# Patient Record
Sex: Male | Born: 1980 | Race: White | Hispanic: No | Marital: Single | Smoking: Former smoker
Health system: Southern US, Community
[De-identification: ages and names within clinical notes are randomized; demographics above are authoritative.]

## PROBLEM LIST (undated history)

## (undated) DIAGNOSIS — F419 Anxiety disorder, unspecified: Secondary | ICD-10-CM

## (undated) DIAGNOSIS — M109 Gout, unspecified: Secondary | ICD-10-CM

## (undated) DIAGNOSIS — G4733 Obstructive sleep apnea (adult) (pediatric): Secondary | ICD-10-CM

## (undated) DIAGNOSIS — I10 Essential (primary) hypertension: Secondary | ICD-10-CM

## (undated) DIAGNOSIS — F909 Attention-deficit hyperactivity disorder, unspecified type: Secondary | ICD-10-CM

## (undated) HISTORY — DX: Obstructive sleep apnea (adult) (pediatric): G47.33

## (undated) HISTORY — PX: CYSTECTOMY: SUR359

---

## 1998-07-09 ENCOUNTER — Emergency Department (HOSPITAL_COMMUNITY): Admission: EM | Admit: 1998-07-09 | Discharge: 1998-07-09 | Payer: Self-pay | Admitting: Emergency Medicine

## 1998-07-09 ENCOUNTER — Encounter: Payer: Self-pay | Admitting: Emergency Medicine

## 1998-09-28 ENCOUNTER — Emergency Department (HOSPITAL_COMMUNITY): Admission: EM | Admit: 1998-09-28 | Discharge: 1998-09-28 | Payer: Self-pay | Admitting: Internal Medicine

## 1998-10-01 ENCOUNTER — Emergency Department (HOSPITAL_COMMUNITY): Admission: EM | Admit: 1998-10-01 | Discharge: 1998-10-02 | Payer: Self-pay | Admitting: Emergency Medicine

## 2001-09-30 ENCOUNTER — Emergency Department (HOSPITAL_COMMUNITY): Admission: EM | Admit: 2001-09-30 | Discharge: 2001-09-30 | Payer: Self-pay | Admitting: Emergency Medicine

## 2001-10-04 ENCOUNTER — Emergency Department (HOSPITAL_COMMUNITY): Admission: EM | Admit: 2001-10-04 | Discharge: 2001-10-04 | Payer: Self-pay | Admitting: Emergency Medicine

## 2003-12-28 ENCOUNTER — Emergency Department (HOSPITAL_COMMUNITY): Admission: EM | Admit: 2003-12-28 | Discharge: 2003-12-28 | Payer: Self-pay | Admitting: Emergency Medicine

## 2005-12-10 ENCOUNTER — Emergency Department (HOSPITAL_COMMUNITY): Admission: EM | Admit: 2005-12-10 | Discharge: 2005-12-10 | Payer: Self-pay | Admitting: Emergency Medicine

## 2005-12-17 ENCOUNTER — Emergency Department (HOSPITAL_COMMUNITY): Admission: EM | Admit: 2005-12-17 | Discharge: 2005-12-17 | Payer: Self-pay | Admitting: Emergency Medicine

## 2006-01-13 ENCOUNTER — Ambulatory Visit (HOSPITAL_BASED_OUTPATIENT_CLINIC_OR_DEPARTMENT_OTHER): Admission: RE | Admit: 2006-01-13 | Discharge: 2006-01-13 | Payer: Self-pay | Admitting: Otolaryngology

## 2012-08-14 ENCOUNTER — Encounter (HOSPITAL_COMMUNITY): Payer: Self-pay | Admitting: *Deleted

## 2012-08-14 ENCOUNTER — Emergency Department (HOSPITAL_COMMUNITY)
Admission: EM | Admit: 2012-08-14 | Discharge: 2012-08-15 | Disposition: A | Discharge status: Home / Self Care | Payer: Self-pay | Attending: Emergency Medicine | Admitting: Emergency Medicine

## 2012-08-14 DIAGNOSIS — K089 Disorder of teeth and supporting structures, unspecified: Secondary | ICD-10-CM | POA: Insufficient documentation

## 2012-08-14 DIAGNOSIS — K0889 Other specified disorders of teeth and supporting structures: Secondary | ICD-10-CM

## 2012-08-14 DIAGNOSIS — L03211 Cellulitis of face: Secondary | ICD-10-CM

## 2012-08-14 DIAGNOSIS — H539 Unspecified visual disturbance: Secondary | ICD-10-CM | POA: Insufficient documentation

## 2012-08-14 DIAGNOSIS — H5710 Ocular pain, unspecified eye: Secondary | ICD-10-CM | POA: Insufficient documentation

## 2012-08-14 DIAGNOSIS — L0201 Cutaneous abscess of face: Secondary | ICD-10-CM | POA: Insufficient documentation

## 2012-08-14 DIAGNOSIS — H9209 Otalgia, unspecified ear: Secondary | ICD-10-CM | POA: Insufficient documentation

## 2012-08-14 DIAGNOSIS — K029 Dental caries, unspecified: Secondary | ICD-10-CM

## 2012-08-14 DIAGNOSIS — Z79899 Other long term (current) drug therapy: Secondary | ICD-10-CM | POA: Insufficient documentation

## 2012-08-14 DIAGNOSIS — F909 Attention-deficit hyperactivity disorder, unspecified type: Secondary | ICD-10-CM | POA: Insufficient documentation

## 2012-08-14 DIAGNOSIS — Z87891 Personal history of nicotine dependence: Secondary | ICD-10-CM | POA: Insufficient documentation

## 2012-08-14 HISTORY — DX: Attention-deficit hyperactivity disorder, unspecified type: F90.9

## 2012-08-14 MED ORDER — OXYCODONE-ACETAMINOPHEN 5-325 MG PO TABS
2.0000 | ORAL_TABLET | Freq: Once | ORAL | Status: AC
  Administered 2012-08-14: 2 via ORAL
  Filled 2012-08-14: qty 2

## 2012-08-14 NOTE — ED Notes (Signed)
Pt c/o dental abscess x1 day.  Reports 8/10 pain.  Swelling noted on r jaw. Pt NAD at this time.

## 2012-08-14 NOTE — ED Provider Notes (Signed)
History    This chart was scribed for non-physician practitioner working with Celene Kras, MD by Leone Payor, ED Scribe. This patient was seen in room WA11/WA11 and the patient's care was started at 2205.   CSN: [id number]  Arrival date & time 08/14/12  2205   First MD Initiated Contact with Patient 08/14/12 2307      Chief Complaint  Patient presents with  . Abscess  . Dental Pain  . Facial Swelling     The history is provided by the patient. No language interpreter was used.    Geoffry Bannister is a 32 y.o. male who presents to the Emergency Department complaining of constant, gradually worsening dental pain to the right upper side onset yesterday with swelling of the right jaw that occurred overnight. Pt reports having a h/o dental abscesses. He denies seeing a dentist.  Pt rates the pain as 8/10. He denies using OTC medications for pain or any steroids or immunosuppressant drugs. Denies IV drug use. Has trouble eating 2/2 pain but is okay drinking. He has right sided ear pain, right sided eye pain, blurry vision. He denies fever, chills, nausea, vomiting, trouble swallowing, SOB.   Pt is a former smoker but denies alcohol use.    Past Medical History  Diagnosis Date  . ADHD (attention deficit hyperactivity disorder)     History reviewed. No pertinent past surgical history.  History reviewed. No pertinent family history.  History  Substance Use Topics  . Smoking status: Former Smoker    Quit date: 02/26/2012  . Smokeless tobacco: Not on file  . Alcohol Use: No      Review of Systems  Constitutional: Negative for fever, diaphoresis, appetite change, fatigue and unexpected weight change.  HENT: Positive for ear pain (right sided), facial swelling and dental problem. Negative for mouth sores, trouble swallowing and neck stiffness.   Eyes: Positive for pain (right sided) and visual disturbance.  Respiratory: Negative for cough, chest tightness, shortness of breath  and wheezing.   Cardiovascular: Negative for chest pain.  Gastrointestinal: Negative for nausea, vomiting, abdominal pain, diarrhea and constipation.  Endocrine: Negative for polydipsia, polyphagia and polyuria.  Genitourinary: Negative for dysuria, urgency, frequency and hematuria.  Musculoskeletal: Negative for back pain.  Skin: Negative for rash.  Allergic/Immunologic: Negative for immunocompromised state.  Neurological: Negative for syncope, light-headedness and headaches.  Hematological: Does not bruise/bleed easily.  Psychiatric/Behavioral: Negative for sleep disturbance. The patient is not nervous/anxious.     Allergies  Review of patient's allergies indicates no known allergies.  Home Medications   Current Outpatient Rx  Name  Route  Sig  Dispense  Refill  . amphetamine-dextroamphetamine (ADDERALL) 30 MG tablet   Oral   Take 30 mg by mouth daily.         . clindamycin (CLEOCIN) 150 MG capsule   Oral   Take 3 capsules (450 mg total) by mouth 3 (three) times daily.   90 capsule   0   . HYDROcodone-acetaminophen (NORCO/VICODIN) 5-325 MG per tablet   Oral   Take 2 tablets by mouth every 4 (four) hours as needed for pain.   10 tablet   0     BP 141/81  Pulse 73  Temp(Src) 98.3 F (36.8 C) (Oral)  Resp 17  SpO2 97%  Physical Exam  Nursing note and vitals reviewed. Constitutional: He is oriented to person, place, and time. He appears well-developed and well-nourished.  HENT:  Head: Normocephalic.  Right Ear: Tympanic membrane, external ear  and ear canal normal.  Left Ear: Tympanic membrane, external ear and ear canal normal.  Nose: Nose normal. Right sinus exhibits no maxillary sinus tenderness and no frontal sinus tenderness. Left sinus exhibits no maxillary sinus tenderness and no frontal sinus tenderness.  Mouth/Throat: Uvula is midline, oropharynx is clear and moist and mucous membranes are normal. No oral lesions. Abnormal dentition. Dental caries present.  No edematous or lacerations. No oropharyngeal exudate, posterior oropharyngeal edema, posterior oropharyngeal erythema or tonsillar abscesses.  Tenderness to palpation over right maxillary sinus.  Multiple caries and missing teeth. Mild erythema and swelling of the gumline, no gross abscess  Eyes: Conjunctivae and EOM are normal. Pupils are equal, round, and reactive to light. Right eye exhibits no discharge. Left eye exhibits no discharge. Right conjunctiva is not injected. Right conjunctiva has no hemorrhage. Left conjunctiva is not injected. Left conjunctiva has no hemorrhage. No scleral icterus. Right eye exhibits normal extraocular motion. Left eye exhibits normal extraocular motion.  Neck: Normal range of motion. Neck supple.  Cardiovascular: Normal rate, regular rhythm, normal heart sounds and intact distal pulses.   Pulmonary/Chest: Effort normal and breath sounds normal. No respiratory distress. He has no wheezes.  Abdominal: Soft. Bowel sounds are normal. He exhibits no distension. There is no tenderness.  Musculoskeletal: Normal range of motion.  Lymphadenopathy:       Head (right side): No submental, no submandibular, no tonsillar, no preauricular, no posterior auricular and no occipital adenopathy present.       Head (left side): No submental, no submandibular, no tonsillar, no preauricular, no posterior auricular and no occipital adenopathy present.    He has no cervical adenopathy.  Neurological: He is alert and oriented to person, place, and time. No cranial nerve deficit. Coordination normal.  Skin: Skin is warm and dry.  Psychiatric: He has a normal mood and affect.    ED Course  Procedures (including critical care time)  DIAGNOSTIC STUDIES: Oxygen Saturation is 99% on room air, normal by my interpretation.    COORDINATION OF CARE: 11:52 PM Discussed treatment plan with pt at bedside and pt agreed to plan.    Labs Reviewed - No data to display Ct Maxillofacial  W/cm  08/15/2012  *RADIOLOGY REPORT*  Clinical Data: Facial pain and swelling mainly around the right side of the jaw.  CT MAXILLOFACIAL WITH CONTRAST  Technique:  Multidetector CT imaging of the maxillofacial structures was performed with intravenous contrast. Multiplanar CT image reconstructions were also generated.  Contrast: OMNIPAQUE IOHEXOL 300 MG/ML  SOLN  Comparison: None.  Findings: There is subcutaneous swelling, edema and fluid in the right facial area without discrete abscess.  There is extensive dental disease with numerous dental caries. Apical lucency around several teeth are noted bilaterally. There is apical lucency around the right maxillary canine tooth which could be responsible for the adjacent facial inflammation/cellulitis.  No discrete bone abscess. The tongue base and floor of mouth are unremarkable.  The paranasal sinuses are grossly clear.  There is minimal mucoperiosteal thickening involving the right maxillary sinus.  No air-fluid levels.  The mastoid air cells and middle ear cavities are clear.  The globes are intact.  No orbital cellulitis.  The visualized portion of the brain is normal.  The epiglottis is normal and the aryepiglottic folds are normal.  The piriform sinus and vallecular air spaces are clear.  The paraglottic fat planes are maintained. Mild enlargement of the tonsils but no inflammation or abscess.  There are small scattered bilateral  neck nodes but no adenopathy or mass.  IMPRESSION:  1.  Bilateral maxillary and mandibular dental disease. 2.  Right-sided facial cellulitis but no discrete abscess.   Original Report Authenticated By: Rudie Meyer, M.D.      1. Pain, dental   2. Dental caries   3. Cellulitis, face       MDM  Tamsen Meek presents with dental pain and concern of dental abscess.  Patient with toothache.  No gross abscess.  Pt exam concerning for possible deep seated abscess.  Will obtain CT maxillofacial.  Pt pain controlled in the  ER.    Ct with Bilateral maxillary and mandibular dental disease. Right-sided facial cellulitis but no discrete abscess.  Exam unconcerning for Ludwig's angina or spread of infection.  Will treat with clindamycin and pain medicine.  Urged patient to follow-up with dentist.  Pt is afebrile, NAD, nontoxic, nonseptic appearing.  Pt is without diplopia and there is no evidence for periorbital or preseptal cellulitis.    I personally performed the services described in this documentation, which was scribed in my presence. The recorded information has been reviewed and is accurate.   Dahlia Client Weldon Nouri, PA-C 08/15/12 0111  Teagen Bucio, PA-C 08/15/12 4540

## 2012-08-15 ENCOUNTER — Emergency Department (HOSPITAL_COMMUNITY): Payer: Self-pay

## 2012-08-15 ENCOUNTER — Encounter (HOSPITAL_COMMUNITY): Payer: Self-pay

## 2012-08-15 MED ORDER — PENICILLIN V POTASSIUM 500 MG PO TABS: 500.0000 mg | ORAL_TABLET | Freq: Four times a day (QID) | ORAL | Status: DC

## 2012-08-15 MED ORDER — HYDROCODONE-ACETAMINOPHEN 5-325 MG PO TABS: 2.0000 | ORAL_TABLET | ORAL | Status: DC | PRN

## 2012-08-15 MED ORDER — CLINDAMYCIN HCL 150 MG PO CAPS: 450.0000 mg | ORAL_CAPSULE | Freq: Three times a day (TID) | ORAL | Status: DC

## 2012-08-15 MED ORDER — IOHEXOL 300 MG/ML  SOLN
100.0000 mL | Freq: Once | INTRAMUSCULAR | Status: AC | PRN
  Administered 2012-08-15: 100 mL via INTRAVENOUS

## 2012-08-15 NOTE — ED Provider Notes (Signed)
Medical screening examination/treatment/procedure(s) were performed by non-physician practitioner and as supervising physician I was immediately available for consultation/collaboration.   Yuleimy Kretz, MD 08/15/12 0314 

## 2013-02-25 ENCOUNTER — Emergency Department (HOSPITAL_COMMUNITY)
Admission: EM | Admit: 2013-02-25 | Discharge: 2013-02-25 | Disposition: A | Discharge status: Home / Self Care | Payer: Self-pay | Attending: Emergency Medicine | Admitting: Emergency Medicine

## 2013-02-25 ENCOUNTER — Emergency Department (HOSPITAL_COMMUNITY): Payer: Self-pay

## 2013-02-25 ENCOUNTER — Encounter (HOSPITAL_COMMUNITY): Payer: Self-pay

## 2013-02-25 DIAGNOSIS — IMO0002 Reserved for concepts with insufficient information to code with codable children: Secondary | ICD-10-CM | POA: Insufficient documentation

## 2013-02-25 DIAGNOSIS — S6990XA Unspecified injury of unspecified wrist, hand and finger(s), initial encounter: Secondary | ICD-10-CM | POA: Insufficient documentation

## 2013-02-25 DIAGNOSIS — Y9389 Activity, other specified: Secondary | ICD-10-CM | POA: Insufficient documentation

## 2013-02-25 DIAGNOSIS — F909 Attention-deficit hyperactivity disorder, unspecified type: Secondary | ICD-10-CM | POA: Insufficient documentation

## 2013-02-25 DIAGNOSIS — Z87891 Personal history of nicotine dependence: Secondary | ICD-10-CM | POA: Insufficient documentation

## 2013-02-25 DIAGNOSIS — Z79899 Other long term (current) drug therapy: Secondary | ICD-10-CM | POA: Insufficient documentation

## 2013-02-25 DIAGNOSIS — Y929 Unspecified place or not applicable: Secondary | ICD-10-CM | POA: Insufficient documentation

## 2013-02-25 MED ORDER — HYDROCODONE-ACETAMINOPHEN 5-325 MG PO TABS: 2.0000 | ORAL_TABLET | Freq: Four times a day (QID) | ORAL | Status: DC | PRN

## 2013-02-25 MED ORDER — HYDROCODONE-ACETAMINOPHEN 5-325 MG PO TABS
2.0000 | ORAL_TABLET | Freq: Once | ORAL | Status: AC
  Administered 2013-02-25: 2 via ORAL
  Filled 2013-02-25: qty 2

## 2013-02-25 MED ORDER — IBUPROFEN 800 MG PO TABS
800.0000 mg | ORAL_TABLET | Freq: Once | ORAL | Status: AC
  Administered 2013-02-25: 800 mg via ORAL
  Filled 2013-02-25: qty 1

## 2013-02-25 MED ORDER — IBUPROFEN 800 MG PO TABS: 800.0000 mg | ORAL_TABLET | Freq: Three times a day (TID) | ORAL | Status: DC

## 2013-02-25 NOTE — ED Notes (Signed)
Patient has not traveled internationally or been around anyone who has.

## 2013-02-25 NOTE — Progress Notes (Signed)
[facility] provided pt with a list of primary care resources. Patient stated that he may have insurance through his job.
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
## 2013-02-25 NOTE — ED Notes (Signed)
Patient was educated not to drive, operate heavy machinery, or drink alcohol while taking narcotic medication.  
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
## 2013-03-30 ENCOUNTER — Encounter (HOSPITAL_COMMUNITY): Payer: Self-pay | Admitting: Emergency Medicine

## 2013-03-30 ENCOUNTER — Emergency Department (HOSPITAL_COMMUNITY)
Admission: EM | Admit: 2013-03-30 | Discharge: 2013-03-30 | Disposition: A | Discharge status: Home / Self Care | Payer: Self-pay | Attending: Emergency Medicine | Admitting: Emergency Medicine

## 2013-03-30 ENCOUNTER — Emergency Department (HOSPITAL_COMMUNITY): Payer: Self-pay

## 2013-03-30 DIAGNOSIS — Y929 Unspecified place or not applicable: Secondary | ICD-10-CM | POA: Insufficient documentation

## 2013-03-30 DIAGNOSIS — Y939 Activity, unspecified: Secondary | ICD-10-CM | POA: Insufficient documentation

## 2013-03-30 DIAGNOSIS — S60222A Contusion of left hand, initial encounter: Secondary | ICD-10-CM

## 2013-03-30 DIAGNOSIS — F909 Attention-deficit hyperactivity disorder, unspecified type: Secondary | ICD-10-CM | POA: Insufficient documentation

## 2013-03-30 DIAGNOSIS — IMO0002 Reserved for concepts with insufficient information to code with codable children: Secondary | ICD-10-CM | POA: Insufficient documentation

## 2013-03-30 DIAGNOSIS — Z79899 Other long term (current) drug therapy: Secondary | ICD-10-CM | POA: Insufficient documentation

## 2013-03-30 DIAGNOSIS — Z87891 Personal history of nicotine dependence: Secondary | ICD-10-CM | POA: Insufficient documentation

## 2013-03-30 DIAGNOSIS — S60221A Contusion of right hand, initial encounter: Secondary | ICD-10-CM

## 2013-03-30 DIAGNOSIS — S60229A Contusion of unspecified hand, initial encounter: Secondary | ICD-10-CM | POA: Insufficient documentation

## 2013-03-30 MED ORDER — IBUPROFEN 800 MG PO TABS: 800.0000 mg | ORAL_TABLET | Freq: Three times a day (TID) | ORAL | Status: DC | PRN

## 2013-03-30 MED ORDER — HYDROCODONE-ACETAMINOPHEN 5-325 MG PO TABS
1.0000 | ORAL_TABLET | Freq: Once | ORAL | Status: AC
  Administered 2013-03-30: 1 via ORAL
  Filled 2013-03-30: qty 1

## 2013-03-30 MED ORDER — HYDROCODONE-ACETAMINOPHEN 5-325 MG PO TABS: 1.0000 | ORAL_TABLET | Freq: Four times a day (QID) | ORAL | Status: DC | PRN

## 2013-03-30 MED ORDER — IBUPROFEN 800 MG PO TABS
800.0000 mg | ORAL_TABLET | Freq: Once | ORAL | Status: AC
  Administered 2013-03-30: 800 mg via ORAL
  Filled 2013-03-30: qty 1

## 2013-03-30 NOTE — ED Notes (Signed)
Pt reports punching a door multiple times today. Both knuckles swollen
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
## 2013-03-30 NOTE — ED Provider Notes (Signed)
Medical screening examination/treatment/procedure(s) were performed by non-physician practitioner and as supervising physician I was immediately available for consultation/collaboration.  EKG Interpretation   None         Roselyne Stalnaker M Trimaine Maser, MD 03/30/13 1601 

## 2013-04-02 ENCOUNTER — Telehealth (HOSPITAL_BASED_OUTPATIENT_CLINIC_OR_DEPARTMENT_OTHER): Payer: Self-pay | Admitting: Emergency Medicine

## 2013-09-01 ENCOUNTER — Emergency Department (HOSPITAL_COMMUNITY)
Admission: EM | Admit: 2013-09-01 | Discharge: 2013-09-02 | Disposition: A | Discharge status: Home / Self Care | Payer: No Typology Code available for payment source | Attending: Emergency Medicine | Admitting: Emergency Medicine

## 2013-09-01 ENCOUNTER — Encounter (HOSPITAL_COMMUNITY): Payer: Self-pay | Admitting: Emergency Medicine

## 2013-09-01 DIAGNOSIS — F909 Attention-deficit hyperactivity disorder, unspecified type: Secondary | ICD-10-CM | POA: Insufficient documentation

## 2013-09-01 DIAGNOSIS — Z79899 Other long term (current) drug therapy: Secondary | ICD-10-CM | POA: Insufficient documentation

## 2013-09-01 DIAGNOSIS — Z87891 Personal history of nicotine dependence: Secondary | ICD-10-CM | POA: Insufficient documentation

## 2013-09-01 DIAGNOSIS — T6592XA Toxic effect of unspecified substance, intentional self-harm, initial encounter: Secondary | ICD-10-CM | POA: Insufficient documentation

## 2013-09-01 DIAGNOSIS — F4324 Adjustment disorder with disturbance of conduct: Secondary | ICD-10-CM | POA: Diagnosis present

## 2013-09-01 DIAGNOSIS — R61 Generalized hyperhidrosis: Secondary | ICD-10-CM | POA: Insufficient documentation

## 2013-09-01 HISTORY — DX: Anxiety disorder, unspecified: F41.9

## 2013-09-01 NOTE — ED Notes (Addendum)
Per EMS report: pt from home: pt had a stressful day and drank roughly 10ml of a 30ml bottole of 100mg  liquid nicotine.  Pt repeatedly denies SI.  Pt lethargic but arousalble.  Pt able to answer all question. EMS report pt was cool,clammy, diaphoretic, and pale earlier.  On arrival to ED, pt is pale but is no longer diaphoretic. Estimated time of ingestion was about 18:00.

## 2013-09-01 NOTE — ED Notes (Signed)
Bed: EA54WA24 Expected date:  Expected time:  Means of arrival:  Comments: EMS 33yo M, drank bottle of Nicotine Vapor Liquid

## 2013-09-01 NOTE — ED Notes (Signed)
Poison Control Called: mild to moderate toxicity: stimulation affects and GI symptoms  Drowsiness is an unexpected symptom for mid to moderate toxicity.  Recommendations: EKG, cardiac monitor, fluids, tylenol level, UDS, observe for about 4 hours or until he is back to baseline

## 2013-09-02 ENCOUNTER — Encounter (HOSPITAL_COMMUNITY): Payer: Self-pay | Admitting: Psychiatry

## 2013-09-02 DIAGNOSIS — F988 Other specified behavioral and emotional disorders with onset usually occurring in childhood and adolescence: Secondary | ICD-10-CM

## 2013-09-02 DIAGNOSIS — F4324 Adjustment disorder with disturbance of conduct: Secondary | ICD-10-CM | POA: Diagnosis present

## 2013-09-02 DIAGNOSIS — F39 Unspecified mood [affective] disorder: Secondary | ICD-10-CM

## 2013-09-02 LAB — CBC
HCT: 42.4 % (ref 39.0–52.0)
HEMOGLOBIN: 14.7 g/dL (ref 13.0–17.0)
MCH: 30.3 pg (ref 26.0–34.0)
MCHC: 34.7 g/dL (ref 30.0–36.0)
MCV: 87.4 fL (ref 78.0–100.0)
PLATELETS: 300 10*3/uL (ref 150–400)
RBC: 4.85 MIL/uL (ref 4.22–5.81)
RDW: 12.2 % (ref 11.5–15.5)
WBC: 13.7 10*3/uL — AB (ref 4.0–10.5)

## 2013-09-02 LAB — RAPID URINE DRUG SCREEN, HOSP PERFORMED
AMPHETAMINES: POSITIVE — AB
BARBITURATES: NOT DETECTED
Benzodiazepines: NOT DETECTED
Cocaine: NOT DETECTED
OPIATES: NOT DETECTED
TETRAHYDROCANNABINOL: NOT DETECTED

## 2013-09-02 LAB — COMPREHENSIVE METABOLIC PANEL
ALK PHOS: 62 U/L (ref 39–117)
ALT: 27 U/L (ref 0–53)
AST: 34 U/L (ref 0–37)
Albumin: 3.6 g/dL (ref 3.5–5.2)
BILIRUBIN TOTAL: 1.4 mg/dL — AB (ref 0.3–1.2)
BUN: 7 mg/dL (ref 6–23)
CHLORIDE: 101 meq/L (ref 96–112)
CO2: 23 meq/L (ref 19–32)
Calcium: 9.3 mg/dL (ref 8.4–10.5)
Creatinine, Ser: 0.87 mg/dL (ref 0.50–1.35)
GLUCOSE: 114 mg/dL — AB (ref 70–99)
POTASSIUM: 3.8 meq/L (ref 3.7–5.3)
SODIUM: 139 meq/L (ref 137–147)
Total Protein: 7.3 g/dL (ref 6.0–8.3)

## 2013-09-02 LAB — ACETAMINOPHEN LEVEL

## 2013-09-02 LAB — ETHANOL: Alcohol, Ethyl (B): <11 mg/dL (ref 0–11)

## 2013-09-02 LAB — SALICYLATE LEVEL: Salicylate Lvl: <2 mg/dL — ABNORMAL LOW (ref 2.8–20.0)

## 2013-09-02 LAB — CBG MONITORING, ED: Glucose-Capillary: 122 mg/dL — ABNORMAL HIGH (ref 70–99)

## 2013-09-02 MED ORDER — FLUMAZENIL 0.5 MG/5ML IV SOLN
0.2000 mg | Freq: Once | INTRAVENOUS | Status: AC
  Administered 2013-09-02: 0.2 mg via INTRAVENOUS
  Filled 2013-09-02: qty 5

## 2013-09-02 MED ORDER — NALOXONE HCL 1 MG/ML IJ SOLN
INTRAMUSCULAR | Status: AC
  Filled 2013-09-02: qty 2

## 2013-09-02 MED ORDER — NALOXONE HCL 1 MG/ML IJ SOLN
1.0000 mg | Freq: Once | INTRAMUSCULAR | Status: AC
  Administered 2013-09-02: 1 mg via INTRAVENOUS
  Filled 2013-09-02: qty 2

## 2013-09-02 MED ORDER — SODIUM CHLORIDE 0.9 % IV SOLN
1000.0000 mL | Freq: Once | INTRAVENOUS | Status: AC
  Administered 2013-09-02: 1000 mL via INTRAVENOUS

## 2013-09-02 MED ORDER — ONDANSETRON HCL 4 MG/2ML IJ SOLN
4.0000 mg | Freq: Once | INTRAMUSCULAR | Status: AC
  Administered 2013-09-02: 4 mg via INTRAVENOUS
  Filled 2013-09-02: qty 2

## 2013-09-02 MED ORDER — SODIUM CHLORIDE 0.9 % IV SOLN
1000.0000 mL | INTRAVENOUS | Status: DC
  Administered 2013-09-02: 1000 mL via INTRAVENOUS

## 2013-09-02 NOTE — ED Notes (Signed)
Pt's girlfriend: Marchelle Folksmanda: [phone]

## 2013-09-02 NOTE — Progress Notes (Signed)
[facility] provided pt with a [employer] Orange Card application, highlighting [facility].  °

## 2013-09-02 NOTE — BHH Suicide Risk Assessment (Signed)
Suicide Risk Assessment  Discharge Assessment     Demographic Factors:  Male and Living alone  Total Time spent with patient: 20 minutes  Psychiatric Specialty Exam:     Blood pressure 126/62, pulse 80, temperature 97.4 F (36.3 C), temperature source Oral, resp. rate 20, weight 310 lb (140.615 kg), SpO2 100.00%.There is no height on file to calculate BMI.  General Appearance: Casual  Eye Contact::  Good  Speech:  Normal Rate  Volume:  Normal  Mood:  Euthymic  Affect:  Congruent  Thought Process:  Coherent  Orientation:  Full (Time, Place, and Person)  Thought Content:  WDL  Suicidal Thoughts:  No  Homicidal Thoughts:  No  Memory:  Immediate;   Good Recent;   Good Remote;   Good  Judgement:  Fair  Insight:  Good  Psychomotor Activity:  Normal  Concentration:  Good  Recall:  Good  Fund of Knowledge:Good  Language: Good  Akathisia:  No  Handed:  Right  AIMS (if indicated):     Assets:  Housing Leisure Time Physical Health Resilience Social Support Vocational/Educational  Sleep:       Musculoskeletal: Strength & Muscle Tone: within normal limits Gait & Station: normal Patient leans: N/A   Mental Status Per Nursing Assessment::   On Admission:     Current Mental Status by Physician: NA  Loss Factors: NA  Historical Factors: NA  Risk Reduction Factors:   Sense of responsibility to family, Employed and Positive social support  Continued Clinical Symptoms:  Attention Deficit Hyperactivity Disorder  Cognitive Features That Contribute To Risk:  None  Suicide Risk:  Minimal: No identifiable suicidal ideation.  Patients presenting with no risk factors but with morbid ruminations; may be classified as minimal risk based on the severity of the depressive symptoms  Discharge Diagnoses:   AXIS I:  ADHD, hyperactive type AXIS II:  Deferred AXIS III:   Past Medical History  Diagnosis Date  . ADHD (attention deficit hyperactivity disorder)   .  Anxiety    AXIS IV:  other psychosocial or environmental problems, problems related to social environment and problems with primary support group AXIS V:  61-70 mild symptoms  Plan Of Care/Follow-up recommendations:  Activity:  as tolerated Diet:  low-sodium heart healthy diet  Is patient on multiple antipsychotic therapies at discharge:  No   Has Patient had three or more failed trials of antipsychotic monotherapy by history:  No  Recommended Plan for Multiple Antipsychotic Therapies: NA    Nanine MeansJamison Lord, PMH-NP 09/02/2013, 11:44 AM

## 2013-09-02 NOTE — ED Notes (Signed)
[facility], pt needs to be seen by psychiatry.

## 2013-09-02 NOTE — BH Assessment (Signed)
Assessment complete. Consulted with Fran Hobson, NP who recommends Pt be seen by psychiatry later this morning to determine if Pt is able to be discharged. Discussed recommendation with Dr. Kevin Campos who agrees with plan. Notified Merle Tai, RN of disposition.  Ford Ellis Warrick Jr, LPC, NCC Triage Specialist [phone]  

## 2013-09-02 NOTE — ED Notes (Signed)
Pt had another episode of vomiting 

## 2013-09-02 NOTE — BHH Counselor (Signed)
Writer provided following resources to pt. Pt to be d/c home -  Outpatient Psychiatry and Counseling  Therapeutic Alternatives: Mobile Crisis Management:  [phone]  [facility]         [address] [place], [place] [address] [phone]  [facility] sliding scale fee and walk in schedule: M-F 8am-12pm/1pm-3pm [address] [place], [place] [address] [phone]  [facility]/ Intensive [facility]patient Therapy [facility] [address] [place], [place] [address] [phone]  [facility]   [facility] [address]   [address] [place], [place] [address]    [place] [address] [phone]     [phone]  [facility]               [facility] [address]  [address] [place] [address]    [place] [address] [phone]     [phone]  Andee PolesParish McKinney, MD    [facility] [address]    [address] [place] [address]    [place] [address] [phone]      [facility]   [facility] [address]   [address] [place]     [place], [place] [phone]     [phone]  [facility]    [facility] [phone]    Shamsher Ahluwalia, MD [place], [place]                  [address] [address] [phone]     [place] [address] [phone] [facility]      [address]   [facility]: (Spanish speakin)   [place], [place] [address]     [phone]  [facility]    [facility] [address]    [address] [place], [place] [address]    [place] [address] [phone]     [phone]  Evette Cristalaroline Paige  Nare Gaspari, [place]LCSWA Assessment Counselor

## 2013-09-02 NOTE — ED Notes (Signed)
Patient wanded by security, belongings locked in locker 31, and [facility]

## 2013-09-02 NOTE — ED Notes (Signed)
Pt vomited 100cc of pink emesis.Pt had pink [place] beverage earlier.

## 2013-09-02 NOTE — ED Notes (Signed)
After the dose of romazicon and narcan, pt appeared to be more awake.

## 2013-09-02 NOTE — ED Notes (Signed)
Pt had not urinated.  Informed pt that it was necessary to get urine for lab work.   Will try again in 30 minutes.

## 2013-09-02 NOTE — ED Notes (Signed)
Bed: [id number] Expected date:  Expected time:  Means of arrival:  Comments: Ciresi, roll over in bed to 31

## 2013-09-02 NOTE — ED Notes (Signed)
Patient reports he would never take the nicotine product.  Admits it was a silly thing to do.  Having relationship problems with girlfriend who is asleep at bedside.  He reports they are friends now.  Does not feel like he needs counseling.
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
## 2013-09-02 NOTE — ED Provider Notes (Signed)
CSN: [id number]     Arrival date & time 09/01/13  2242 History   First MD Initiated Contact with Patient 09/01/13 2304     Chief Complaint  Patient presents with  . Drug Overdose      The history is provided by the patient.   Patient presents emergency department after intentional overdose with nicotine liquid.  He thinks that he drinks 3-4 mL's of nicotine liquid.  She denies coingestion.  Family does report that there is a whole bottle of Xanax in the house.  He's also prescribed Adderall.  Patient denies taking Adderall and Xanax.  He denies drinking alcohol today.  He denies ibuprofen and Tylenol ingestion.  He did this after a fight with his girlfriend.  His girlfriend was concerned based on the text messages that he had said that this was an intentional overdose to harm himself.   Past Medical History  Diagnosis Date  . ADHD (attention deficit hyperactivity disorder)   . Anxiety    Past Surgical History  Procedure Laterality Date  . Cystectomy      chin   No family history on file. History  Substance Use Topics  . Smoking status: Former Smoker    Quit date: 02/26/2012  . Smokeless tobacco: Never Used  . Alcohol Use: No    Review of Systems  All other systems reviewed and are negative.     Allergies  Review of patient's allergies indicates no known allergies.  Home Medications   Current Outpatient Rx  Name  Route  Sig  Dispense  Refill  . amphetamine-dextroamphetamine (ADDERALL) 30 MG tablet   Oral   Take 30 mg by mouth daily.         . colchicine 0.6 MG tablet   Oral   Take 0.6 mg by mouth daily.          BP 127/82  Pulse 64  Temp(Src) 97.5 F (36.4 C) (Oral)  Resp 20  Wt 310 lb (140.615 kg)  SpO2 100% Physical Exam  Nursing note and vitals reviewed. Constitutional: He is oriented to person, place, and time. He appears well-developed and well-nourished.  Pale and diaphoretic  HENT:  Head: Normocephalic and atraumatic.  Eyes: EOM are  normal.  Neck: Normal range of motion.  Cardiovascular: Normal rate, regular rhythm, normal heart sounds and intact distal pulses.   Pulmonary/Chest: Effort normal and breath sounds normal. No respiratory distress.  Abdominal: Soft. He exhibits no distension. There is no tenderness.  Musculoskeletal: Normal range of motion.  Neurological: He is alert and oriented to person, place, and time.  Skin: Skin is warm and dry.  Psychiatric: He has a normal mood and affect. Judgment normal.    ED Course  Procedures (including critical care time) Labs Review Labs Reviewed  CBC - Abnormal; Notable for the following:    WBC 13.7 (*)    All other components within normal limits  COMPREHENSIVE METABOLIC PANEL - Abnormal; Notable for the following:    Glucose, Bld 114 (*)    Total Bilirubin 1.4 (*)    All other components within normal limits  SALICYLATE LEVEL - Abnormal; Notable for the following:    Salicylate Lvl <2.0 (*)    All other components within normal limits  URINE RAPID DRUG SCREEN (HOSP PERFORMED) - Abnormal; Notable for the following:    Amphetamines POSITIVE (*)    All other components within normal limits  CBG MONITORING, ED - Abnormal; Notable for the following:    Glucose-Capillary 122 (*)  All other components within normal limits  ACETAMINOPHEN LEVEL  ETHANOL   Imaging Review No results found.   EKG Interpretation   Date/Time:  Sunday September 01 2013 22:43:25 EDT Ventricular Rate:  58 PR Interval:  149 QRS Duration: 105 QT Interval:  464 QTC Calculation: 456 R Axis:   -17 Text Interpretation:  Sinus rhythm Borderline left axis deviation No old  tracing to compare Confirmed by Ladrea Holladay  MD, Daisja Kessinger ([id number]) on 09/02/2013  12:06:58 AM      MDM   Final diagnoses:  None    Patient was somewhat somnolent and sweaty when he I evaluated the patient.  Given the possibility of benzodiazepine use which he does not take on a daily basis the patient was given Romazicon  with improvement in his mental status.  Patient was given IV fluids.  His monitored in emergency apartment for approximately 5 hours and seems to be doing much better at this time.  The patient will be seen and evaluated by the psychiatry team.    Lyanne CoKevin M Janicia Monterrosa, MD 09/02/13 0400

## 2013-09-02 NOTE — BH Assessment (Signed)
Tele Assessment Note   Bernard Lewis is an 33 y.o. male, single, Caucasian who presents to [facility] ED by EMS after intentionally ingesting 2-3 ml of liquid nicotine. Pt accompanied by his ex-girlfriend who participated in the assessment with the patient's consent. Pt states he recently broke up with his girlfriend but they are still good friends. Pt states he was having "a bad day" and "wanted to relax." He denies this ingestion was a suicide attempt. He denies any history of previous suicide attempts. He denies any history of intentional self-injurious behavior. He denies any family history of suicide or mental health problems. Pt reports that his mood has been "okay" prior to today and he denies depressive symptoms. He denies any changes in sleep or appetite. He denies homicidal ideation or history of violence. He denies any history of psychotic symptoms. He denies any alcohol or substance abuse.  Pt says tonight's ingestion was an isolated incident and he has not taken an excess of nicotine or other substances before. He identifies the conflict with his ex-girlfriend today as being his only stressor other than financial concerns. Pt says he works [employer] and that his job is not stressful. He states he was diagnosed with ADHD as a child and still takes Adderall, which is prescribed by his PCP. He denies any mental health treatment other than seeing a psychiatrist as a child for the ADHD diagnosis.  Pt's girlfriend states she does not feel Pt was trying to harm himself and she has no concerns for his safety. She says she has noticed he has been more "down and irritable" lately. Pt says she was initially concerned that Pt was trying to hurt himself but now believes she was mistaken.  Pt is dressed in a hospital gown, slightly drowsy and oriented x4. Speech is slightly slow and motor behavior appears normal. Thought process is coherent and relevant. There is no indication Pt is  responding to internal stimuli or experiencing delusional thought content. Pt's mood is euthymic and affect is somewhat blunted. Pt gave brief answers to all questions but was not uncooperative.  Axis I: ADHD Axis II: Deferred Axis III:  Past Medical History  Diagnosis Date  . ADHD (attention deficit hyperactivity disorder)   . Anxiety    Axis IV: economic problems and problems with primary support group Axis V: GAF=40  Past Medical History:  Past Medical History  Diagnosis Date  . ADHD (attention deficit hyperactivity disorder)   . Anxiety     Past Surgical History  Procedure Laterality Date  . Cystectomy      chin    Family History: No family history on file.  Social History:  reports that he quit smoking about 18 months ago. He has never used smokeless tobacco. He reports that he does not drink alcohol or use illicit drugs.  Additional Social History:  Alcohol / Drug Use Pain Medications: Denies abuse Prescriptions: Denies abuse Over the Counter: Denies abuse History of alcohol / drug use?: No history of alcohol / drug abuse Longest period of sobriety (when/how long): NA  CIWA: CIWA-Ar BP: 127/82 mmHg Pulse Rate: 64 COWS:    Allergies: No Known Allergies  Home Medications:  (Not in a hospital admission)  OB/GYN Status:  No LMP for male patient.  General Assessment Data Location of Assessment: [facility] ED Is this a Tele or Face-to-Face Assessment?: Tele Assessment Is this an Initial Assessment or a Re-assessment for this encounter?: Initial Assessment Living Arrangements: Alone Can pt return to  current living arrangement?: Yes Admission Status: Voluntary Is patient capable of signing voluntary admission?: Yes Transfer from: [facility] Referral Source: Self/Family/Friend     [place] Living Arrangements: Alone Name of Psychiatrist: None Name of Therapist: None  Education Status Is patient currently in school?: No Current Grade:  NA Highest grade of school patient has completed: NA Name of school: NA Contact person: NA  Risk to self Suicidal Ideation: No Suicidal Intent: No Is patient at risk for suicide?: No Suicidal Plan?: No Access to Means: No What has been your use of drugs/alcohol within the last 12 months?: Pt denies Previous Attempts/Gestures: No How many times?: 0 Other Self Harm Risks: None Triggers for Past Attempts: None known Intentional Self Injurious Behavior: None Family Suicide History: No Recent stressful life event(s): Conflict (Comment) (Broke up with girlfriend) Persecutory voices/beliefs?: No Depression: Yes Depression Symptoms: Despondent;Feeling angry/irritable Substance abuse history and/or treatment for substance abuse?: No Suicide prevention information given to non-admitted patients: Not applicable  Risk to Others Homicidal Ideation: No Thoughts of Harm to Others: No Current Homicidal Intent: No Current Homicidal Plan: No Access to Homicidal Means: No Identified Victim: None History of harm to others?: No Assessment of Violence: None Noted Violent Behavior Description: None Does patient have access to weapons?: No Criminal Charges Pending?: No Does patient have a court date: No  Psychosis Hallucinations: None noted Delusions: None noted  Mental Status Report Appear/Hygiene: Other (Comment) [facility] gown) Eye Contact: Good Motor Activity: Unremarkable Speech: Logical/coherent Level of Consciousness: Alert;Drowsy Mood: Other (Comment) (Euthymic) Affect: Blunted Anxiety Level: None Thought Processes: Coherent;Relevant Judgement: Unimpaired Orientation: Person;Place;Time;Situation Obsessive Compulsive Thoughts/Behaviors: None  Cognitive Functioning Concentration: Normal Memory: Recent Intact;Remote Intact IQ: Average Insight: Fair Impulse Control: Fair Appetite: Good Weight Loss: 0 Weight Gain: 0 Sleep: No Change Total Hours of Sleep: 8 Vegetative  Symptoms: None  ADLScreening [facility] Assessment Services) Patient's cognitive ability adequate to safely complete daily activities?: Yes Patient able to express need for assistance with ADLs?: Yes Independently performs ADLs?: Yes (appropriate for developmental age)  Prior Inpatient Therapy Prior Inpatient Therapy: No Prior Therapy Dates: NA Prior Therapy Facilty/Provider(s): NA Reason for Treatment: NA  Prior Outpatient Therapy Prior Outpatient Therapy: Yes Prior Therapy Dates: As adolescent Prior Therapy Facilty/Provider(s): unknown psychiatrist Reason for Treatment: ADHD  ADL Screening (condition at time of admission) Patient's cognitive ability adequate to safely complete daily activities?: Yes Is the patient deaf or have difficulty hearing?: No Does the patient have difficulty seeing, even when wearing glasses/contacts?: No Does the patient have difficulty concentrating, remembering, or making decisions?: No Patient able to express need for assistance with ADLs?: Yes Does the patient have difficulty dressing or bathing?: No Independently performs ADLs?: Yes (appropriate for developmental age) Does the patient have difficulty walking or climbing stairs?: No Weakness of Legs: None Weakness of Arms/Hands: None  Home Assistive Devices/Equipment Home Assistive Devices/Equipment: None    Abuse/Neglect Assessment (Assessment to be complete while patient is alone) Physical Abuse: Denies Verbal Abuse: Denies Sexual Abuse: Denies Exploitation of patient/patient's resources: Denies Self-Neglect: Denies Values / Beliefs Cultural Requests During Hospitalization: None Spiritual Requests During Hospitalization: None   Advance Directives (For Healthcare) Advance Directive: Patient does not have advance directive;Patient would not like information Pre-existing out of facility DNR order (yellow form or pink MOST form): No Nutrition Screen- [facility] Patient's home diet:  Regular  Additional Information 1:1 In Past 12 Months?: No CIRT Risk: No Elopement Risk: No Does patient have medical clearance?: Yes  Disposition: Consulted with Serena Colonel, NP who recommends Pt be seen by psychiatry later this morning to determine if Pt is able to be discharged. Discussed recommendation with Dr. Jola Schmidt who agrees with plan. Notified Burley Saver, RN of disposition.  Disposition Initial Assessment Completed for this Encounter: Yes Disposition of Patient: Other dispositions Other disposition(s): Other (Comment) (Pt to be evaluated by psychiatry for possible discharge)  Evelena Peat, [facility], [facility] Triage Specialist [place]. 09/02/2013 4:20 AM

## 2013-09-02 NOTE — Consult Note (Signed)
[facility] Face-to-Face Psychiatry Consult   Reason for Consult:  Drank liquid nicotine  Referring Physician:  ED MD  Bernard Lewis is an 33 y.o. male. Total Time spent with patient: 20 minutes  Assessment: AXIS I:  ADHD, Inattentive type. Mood disorder unspecified. AXIS II:  Deferred AXIS III:   Past Medical History  Diagnosis Date  . ADHD (attention deficit hyperactivity disorder)   . Anxiety    AXIS IV:  other psychosocial or environmental problems AXIS V:  61-70 mild symptoms  Plan:  No evidence of imminent risk to self or others at present.  Dr. De Nurse assessed the patient and concurs with the treatment plan below.  Subjective:   Bernard Lewis is a 33 y.o. male patient does not warrant admission.  HPI:  The patient had been text arguing with a friend and got upset.  He drank nicotine to calm down but it made him pass out.  When his friend found him, she called 911.  He denies suicidal ideations and no prior attempt or psychiatric care except for his ADHD.  Bernard Lewis also denies drug use and homicidal ideations.  His friend is at his bedside and feels he needs help with anger management but not anything else.  She does not feel he is unsafe and he agrees to go to outpatient care for his anger management, given information.  Bernard Lewis is mentally and physically stable for discharge.   HPI Elements:   Location:  generalized. Quality:  acute. Severity:  mild. Timing:  brief. Duration:  brief. Context:  argument with friend.  Past Psychiatric History: Past Medical History  Diagnosis Date  . ADHD (attention deficit hyperactivity disorder)   . Anxiety     reports that he quit smoking about 18 months ago. He has never used smokeless tobacco. He reports that he does not drink alcohol or use illicit drugs. History reviewed. No pertinent family history. Family History Substance Abuse: No Family Supports: Yes, List: (Girlfriend) Living Arrangements: Alone Can pt return  to current living arrangement?: Yes Abuse/Neglect [facility]) Physical Abuse: Denies Verbal Abuse: Denies Sexual Abuse: Denies Allergies:  No Known Allergies  ACT Assessment Complete:  Yes:    Educational Status    Risk to Self: Risk to self Suicidal Ideation: No Suicidal Intent: No Is patient at risk for suicide?: No Suicidal Plan?: No Access to Means: No What has been your use of drugs/alcohol within the last 12 months?: Pt denies Previous Attempts/Gestures: No How many times?: 0 Other Self Harm Risks: None Triggers for Past Attempts: None known Intentional Self Injurious Behavior: None Family Suicide History: No Recent stressful life event(s): Conflict (Comment) (Broke up with girlfriend) Persecutory voices/beliefs?: No Depression: Yes Depression Symptoms: Despondent;Feeling angry/irritable Substance abuse history and/or treatment for substance abuse?: No Suicide prevention information given to non-admitted patients: Not applicable  Risk to Others: Risk to Others Homicidal Ideation: No Thoughts of Harm to Others: No Current Homicidal Intent: No Current Homicidal Plan: No Access to Homicidal Means: No Identified Victim: None History of harm to others?: No Assessment of Violence: None Noted Violent Behavior Description: None Does patient have access to weapons?: No Criminal Charges Pending?: No Does patient have a court date: No  Abuse: Abuse/Neglect Assessment (Assessment to be complete while patient is alone) Physical Abuse: Denies Verbal Abuse: Denies Sexual Abuse: Denies Exploitation of patient/patient's resources: Denies Self-Neglect: Denies  Prior Inpatient Therapy: Prior Inpatient Therapy Prior Inpatient Therapy: No Prior Therapy Dates: NA Prior Therapy Facilty/Provider(s): NA Reason for Treatment: NA  Prior  Outpatient Therapy: Prior Outpatient Therapy Prior Outpatient Therapy: Yes Prior Therapy Dates: As adolescent Prior Therapy Facilty/Provider(s): unknown  psychiatrist Reason for Treatment: ADHD  Additional Information: Additional Information 1:1 In Past 12 Months?: No CIRT Risk: No Elopement Risk: No Does patient have medical clearance?: Yes                  Objective: Blood pressure 126/62, pulse 80, temperature 97.4 F (36.3 C), temperature source Oral, resp. rate 20, weight 310 lb (140.615 kg), SpO2 100.00%.There is no height on file to calculate BMI. Results for orders placed during the hospital encounter of 09/01/13 (from the past 72 hour(s))  ACETAMINOPHEN LEVEL     Status: None   Collection Time    09/01/13 11:33 PM      Result Value Ref Range   Acetaminophen (Tylenol), Serum <15.0  10 - 30 ug/mL   Comment:            THERAPEUTIC CONCENTRATIONS VARY     SIGNIFICANTLY. A RANGE OF 10-30     ug/mL MAY BE AN EFFECTIVE     CONCENTRATION FOR MANY PATIENTS.     HOWEVER, SOME ARE BEST TREATED     AT CONCENTRATIONS OUTSIDE THIS     RANGE.     ACETAMINOPHEN CONCENTRATIONS     >150 ug/mL AT 4 HOURS AFTER     INGESTION AND >50 ug/mL AT 12     HOURS AFTER INGESTION ARE     OFTEN ASSOCIATED WITH TOXIC     REACTIONS.  CBC     Status: Abnormal   Collection Time    09/01/13 11:33 PM      Result Value Ref Range   WBC 13.7 (*) 4.0 - 10.5 K/uL   RBC 4.85  4.22 - 5.81 MIL/uL   Hemoglobin 14.7  13.0 - 17.0 g/dL   HCT 42.4  39.0 - 52.0 %   MCV 87.4  78.0 - 100.0 fL   MCH 30.3  26.0 - 34.0 pg   MCHC 34.7  30.0 - 36.0 g/dL   RDW 12.2  11.5 - 15.5 %   Platelets 300  150 - 400 K/uL  COMPREHENSIVE METABOLIC PANEL     Status: Abnormal   Collection Time    09/01/13 11:33 PM      Result Value Ref Range   Sodium 139  137 - 147 mEq/L   Potassium 3.8  3.7 - 5.3 mEq/L   Comment: SLIGHT HEMOLYSIS   Chloride 101  96 - 112 mEq/L   CO2 23  19 - 32 mEq/L   Glucose, Bld 114 (*) 70 - 99 mg/dL   BUN 7  6 - 23 mg/dL   Creatinine, Ser 0.87  0.50 - 1.35 mg/dL   Calcium 9.3  8.4 - 10.5 mg/dL   Total Protein 7.3  6.0 - 8.3 g/dL    Albumin 3.6  3.5 - 5.2 g/dL   AST 34  0 - 37 U/L   Comment: SLIGHT HEMOLYSIS   ALT 27  0 - 53 U/L   Comment: SLIGHT HEMOLYSIS   Alkaline Phosphatase 62  39 - 117 U/L   Total Bilirubin 1.4 (*) 0.3 - 1.2 mg/dL   GFR calc non Af Amer >90  >90 mL/min   GFR calc Af Amer >90  >90 mL/min   Comment: (NOTE)     The eGFR has been calculated using the CKD EPI equation.     This calculation has not been validated in all clinical situations.  eGFR's persistently <90 mL/min signify possible Chronic Kidney     Disease.  ETHANOL     Status: None   Collection Time    09/01/13 11:33 PM      Result Value Ref Range   Alcohol, Ethyl (B) <11  0 - 11 mg/dL   Comment:            LOWEST DETECTABLE LIMIT FOR     SERUM ALCOHOL IS 11 mg/dL     FOR MEDICAL PURPOSES ONLY  SALICYLATE LEVEL     Status: Abnormal   Collection Time    09/01/13 11:33 PM      Result Value Ref Range   Salicylate Lvl <6.7 (*) 2.8 - 20.0 mg/dL  CBG MONITORING, ED     Status: Abnormal   Collection Time    09/02/13 12:07 AM      Result Value Ref Range   Glucose-Capillary 122 (*) 70 - 99 mg/dL  URINE RAPID DRUG SCREEN (HOSP PERFORMED)     Status: Abnormal   Collection Time    09/02/13  2:45 AM      Result Value Ref Range   Opiates NONE DETECTED  NONE DETECTED   Cocaine NONE DETECTED  NONE DETECTED   Benzodiazepines NONE DETECTED  NONE DETECTED   Amphetamines POSITIVE (*) NONE DETECTED   Tetrahydrocannabinol NONE DETECTED  NONE DETECTED   Barbiturates NONE DETECTED  NONE DETECTED   Comment:            DRUG SCREEN FOR MEDICAL PURPOSES     ONLY.  IF CONFIRMATION IS NEEDED     FOR ANY PURPOSE, NOTIFY LAB     WITHIN 5 DAYS.                LOWEST DETECTABLE LIMITS     FOR URINE DRUG SCREEN     Drug Class       Cutoff (ng/mL)     Amphetamine      1000     Barbiturate      200     Benzodiazepine   341     Tricyclics       937     Opiates          300     Cocaine          300     THC              50   Labs are reviewed  and are pertinent for no medical issues noted.  Current Facility-Administered Medications  Medication Dose Route Frequency Provider Last Rate Last Dose  . 0.9 %  sodium chloride infusion  1,000 mL Intravenous Continuous Hoy Morn, MD   1,000 mL at 09/02/13 0057   Current Outpatient Prescriptions  Medication Sig Dispense Refill  . amphetamine-dextroamphetamine (ADDERALL) 30 MG tablet Take 30 mg by mouth daily.      . colchicine 0.6 MG tablet Take 0.6 mg by mouth daily.        Psychiatric Specialty Exam:     Blood pressure 126/62, pulse 80, temperature 97.4 F (36.3 C), temperature source Oral, resp. rate 20, weight 310 lb (140.615 kg), SpO2 100.00%.There is no height on file to calculate BMI.  General Appearance: Casual  Eye Contact::  Good  Speech:  Normal Rate  Volume:  Normal  Mood:  Euthymic  Affect:  Congruent  Thought Process:  Coherent  Orientation:  Full (Time, Place, and Person)  Thought Content:  WDL  Suicidal  Thoughts:  No  Homicidal Thoughts:  No  Memory:  Immediate;   Good Recent;   Good Remote;   Good  Judgement:  Fair  Insight:  Good  Psychomotor Activity:  Normal  Concentration:  Good  Recall:  Good  Fund of Knowledge:Good  Language: Good  Akathisia:  No  Handed:  Right  AIMS (if indicated):     Assets:  Housing Leisure Time Physical Health Resilience Social Support Vocational/Educational  Sleep:       Musculoskeletal: Strength & Muscle Tone: within normal limits Gait & Station: normal Patient leans: N/A  Treatment Plan Summary: Discharge home with follow-up with a counselor for anger management.  Waylan Boga, PMH-NP 09/02/2013 11:44 AM I have personally seen the patient and agreed with the findings and involved in the treatment plan. Discharge with outpatient follow ups. Merian Capron, MD

## 2014-12-25 ENCOUNTER — Encounter (HOSPITAL_COMMUNITY): Payer: Self-pay | Admitting: Emergency Medicine

## 2014-12-25 ENCOUNTER — Emergency Department (HOSPITAL_COMMUNITY)
Admission: EM | Admit: 2014-12-25 | Discharge: 2014-12-25 | Disposition: A | Discharge status: Home / Self Care | Payer: 59 | Attending: Emergency Medicine | Admitting: Emergency Medicine

## 2014-12-25 DIAGNOSIS — Z79899 Other long term (current) drug therapy: Secondary | ICD-10-CM | POA: Diagnosis not present

## 2014-12-25 DIAGNOSIS — L03113 Cellulitis of right upper limb: Secondary | ICD-10-CM | POA: Insufficient documentation

## 2014-12-25 DIAGNOSIS — Z87891 Personal history of nicotine dependence: Secondary | ICD-10-CM | POA: Insufficient documentation

## 2014-12-25 DIAGNOSIS — F909 Attention-deficit hyperactivity disorder, unspecified type: Secondary | ICD-10-CM | POA: Diagnosis not present

## 2014-12-25 DIAGNOSIS — M79621 Pain in right upper arm: Secondary | ICD-10-CM | POA: Diagnosis present

## 2014-12-25 DIAGNOSIS — R197 Diarrhea, unspecified: Secondary | ICD-10-CM | POA: Insufficient documentation

## 2014-12-25 LAB — CBC WITH DIFFERENTIAL/PLATELET
Basophils Absolute: 0 10*3/uL (ref 0.0–0.1)
Basophils Relative: 0 % (ref 0–1)
Eosinophils Absolute: 0.1 10*3/uL (ref 0.0–0.7)
Eosinophils Relative: 1 % (ref 0–5)
HCT: 43.1 % (ref 39.0–52.0)
HEMOGLOBIN: 14.5 g/dL (ref 13.0–17.0)
LYMPHS PCT: 25 % (ref 12–46)
Lymphs Abs: 2.7 10*3/uL (ref 0.7–4.0)
MCH: 29.7 pg (ref 26.0–34.0)
MCHC: 33.6 g/dL (ref 30.0–36.0)
MCV: 88.3 fL (ref 78.0–100.0)
MONO ABS: 0.9 10*3/uL (ref 0.1–1.0)
MONOS PCT: 8 % (ref 3–12)
Neutro Abs: 7 10*3/uL (ref 1.7–7.7)
Neutrophils Relative %: 66 % (ref 43–77)
Platelets: 305 10*3/uL (ref 150–400)
RBC: 4.88 MIL/uL (ref 4.22–5.81)
RDW: 12.7 % (ref 11.5–15.5)
WBC: 10.7 10*3/uL — ABNORMAL HIGH (ref 4.0–10.5)

## 2014-12-25 LAB — BASIC METABOLIC PANEL
Anion gap: 8 (ref 5–15)
BUN: 16 mg/dL (ref 6–20)
CO2: 27 mmol/L (ref 22–32)
Calcium: 9.4 mg/dL (ref 8.9–10.3)
Chloride: 107 mmol/L (ref 101–111)
Creatinine, Ser: 1.18 mg/dL (ref 0.61–1.24)
GFR calc non Af Amer: >60 mL/min (ref >60)
Glucose, Bld: 73 mg/dL (ref 65–99)
Potassium: 3.1 mmol/L — ABNORMAL LOW (ref 3.5–5.1)
SODIUM: 142 mmol/L (ref 135–145)

## 2014-12-25 MED ORDER — CLINDAMYCIN HCL 150 MG PO CAPS: 300.0000 mg | ORAL_CAPSULE | Freq: Three times a day (TID) | ORAL | Status: DC

## 2014-12-25 MED ORDER — KETOROLAC TROMETHAMINE 30 MG/ML IJ SOLN
30.0000 mg | Freq: Once | INTRAMUSCULAR | Status: AC
  Administered 2014-12-25: 30 mg via INTRAVENOUS
  Filled 2014-12-25: qty 1

## 2014-12-25 MED ORDER — MORPHINE SULFATE 4 MG/ML IJ SOLN
4.0000 mg | Freq: Once | INTRAMUSCULAR | Status: AC
  Administered 2014-12-25: 4 mg via INTRAVENOUS
  Filled 2014-12-25: qty 1

## 2014-12-25 MED ORDER — ONDANSETRON HCL 4 MG/2ML IJ SOLN
4.0000 mg | Freq: Once | INTRAMUSCULAR | Status: AC
  Administered 2014-12-25: 4 mg via INTRAVENOUS
  Filled 2014-12-25: qty 2

## 2014-12-25 MED ORDER — HYDROCODONE-ACETAMINOPHEN 5-325 MG PO TABS: 2.0000 | ORAL_TABLET | ORAL | Status: DC | PRN

## 2014-12-25 MED ORDER — CLINDAMYCIN PHOSPHATE 600 MG/50ML IV SOLN
600.0000 mg | Freq: Once | INTRAVENOUS | Status: AC
  Administered 2014-12-25: 600 mg via INTRAVENOUS
  Filled 2014-12-25: qty 50

## 2014-12-25 MED ORDER — POTASSIUM CHLORIDE CRYS ER 20 MEQ PO TBCR
40.0000 meq | EXTENDED_RELEASE_TABLET | Freq: Once | ORAL | Status: AC
  Administered 2014-12-25: 40 meq via ORAL
  Filled 2014-12-25: qty 2

## 2014-12-25 MED ORDER — SODIUM CHLORIDE 0.9 % IV BOLUS (SEPSIS)
1000.0000 mL | Freq: Once | INTRAVENOUS | Status: AC
  Administered 2014-12-25: 1000 mL via INTRAVENOUS

## 2014-12-25 NOTE — ED Notes (Signed)
Pt c/o right underarm abscess-now redness and warmth is swelling up the under side of his arm. C/o headaches. Denies N/V/Fever. Also c/o diarrhea. Unknown if he was bitten or etiology of break in skin. No other c/c.

## 2014-12-25 NOTE — ED Notes (Signed)
Pt reports abscess in his R axilla x 3 days.  Denies any drainage at this time.  Small hard knot noted and redness noted.

## 2014-12-25 NOTE — ED Provider Notes (Signed)
History  This chart was scribed for non-physician practitioner, Emilia Beck, PA-C,working with Mancel Bale, MD, by Karle Plumber, ED Scribe. This patient was seen in room WTR8/WTR8 and the patient's care was started at 8:13 PM.  Chief Complaint  Patient presents with  . Abscess   The history is provided by the patient and medical records. No language interpreter was used.    HPI Comments:  Bernard Lewis is a 34 y.o. morbidly obese male who presents to the Emergency Department complaining of an abscess to the right axilla that began three days ago. He reports severe pain and associated redness. He reports some diarrhea and cold chills recently. He has not done anything to treat the symptoms. Touching the area makes the pain worse. He denies alleviating factors. He denies fever, nausea and vomiting.   Past Medical History  Diagnosis Date  . ADHD (attention deficit hyperactivity disorder)   . Anxiety    Past Surgical History  Procedure Laterality Date  . Cystectomy      chin   History reviewed. No pertinent family history. History  Substance Use Topics  . Smoking status: Former Smoker    Quit date: 02/26/2012  . Smokeless tobacco: Never Used  . Alcohol Use: No    Review of Systems  Constitutional: Positive for chills. Negative for fever.  Gastrointestinal: Negative for nausea and vomiting.  Skin: Positive for color change (abscess to right axilla).  All other systems reviewed and are negative.   Allergies  Review of patient's allergies indicates no known allergies.  Home Medications   Prior to Admission medications   Medication Sig Start Date End Date Taking? Authorizing Provider  amphetamine-dextroamphetamine (ADDERALL) 30 MG tablet Take 30 mg by mouth daily.    Historical Provider, MD  colchicine 0.6 MG tablet Take 0.6 mg by mouth daily.    Historical Provider, MD   Triage Vitals: BP 136/86 mmHg  Pulse 74  Temp(Src) 97.8 F (36.6 C) (Oral)  Resp  16  Ht  (1.854 m)  Wt 326 lb (147.873 kg)  BMI 43.02 kg/m2  SpO2 100% Physical Exam  Constitutional: He is oriented to person, place, and time. He appears well-developed and well-nourished. No distress.  HENT:  Head: Normocephalic and atraumatic.  Eyes: Conjunctivae are normal.  Normal appearance  Neck: Normal range of motion.  Cardiovascular: Normal rate and regular rhythm.  Exam reveals no gallop and no friction rub.   No murmur heard. Pulmonary/Chest: Effort normal and breath sounds normal. He has no wheezes. He has no rales. He exhibits no tenderness.  Abdominal: Soft. There is no tenderness.  Musculoskeletal: Normal range of motion.  Neurological: He is alert and oriented to person, place, and time.  Speech is goal-oriented. Moves limbs without ataxia.   Skin: Skin is warm and dry. There is erythema.  2 x 2 cm area of erythema and induration that is tender to palpation of right axilla. Large area of erythema extending distally. No open wounds.  Psychiatric: He has a normal mood and affect. His behavior is normal.  Nursing note and vitals reviewed.   ED Course  Procedures (including critical care time) DIAGNOSTIC STUDIES: Oxygen Saturation is 100% on RA, normal by my interpretation.   COORDINATION OF CARE: 8:17 PM- Will order IV antibiotics and pain medication. Will order lab work. Will prescribe antibiotics prior to discharge. Will provide work note. Pt verbalizes understanding and agrees to plan.  Medications  sodium chloride 0.9 % bolus 1,000 mL (1,000 mLs Intravenous New  Bag/Given 12/25/14 2059)  clindamycin (CLEOCIN) IVPB 600 mg (600 mg Intravenous New Bag/Given 12/25/14 2102)  morphine 4 MG/ML injection 4 mg (4 mg Intravenous Given 12/25/14 2059)  ondansetron (ZOFRAN) injection 4 mg (4 mg Intravenous Given 12/25/14 2100)    Labs Review Labs Reviewed  CBC WITH DIFFERENTIAL/PLATELET - Abnormal; Notable for the following:    WBC 10.7 (*)    All other components within  normal limits  BASIC METABOLIC PANEL - Abnormal; Notable for the following:    Potassium 3.1 (*)    All other components within normal limits    Imaging Review No results found.   EKG Interpretation None      MDM   Final diagnoses:  Cellulitis of right upper extremity   Patient with a mildly elevated WBC at 10.7. Patient given Clindamycin IV. Vitals stable and patient afebrile. Patient will be discharged with clindamycin and Vicodin for symptoms. Patient instructed to return with worsening or concerning symptoms.   I personally performed the services described in this documentation, which was scribed in my presence. The recorded information has been reviewed and is accurate.    [address] [place], PA-C 12/26/14 1610  Mancel Bale, MD 12/26/14 [phone]

## 2014-12-25 NOTE — Discharge Instructions (Signed)
Take Clindamycin as directed until gone. Take vicodin as needed for pain. Return to the ED with worsening or concerning symptoms.

## 2015-11-11 ENCOUNTER — Other Ambulatory Visit: Payer: Self-pay

## 2015-11-11 ENCOUNTER — Encounter (HOSPITAL_COMMUNITY): Payer: Self-pay | Admitting: Emergency Medicine

## 2015-11-11 ENCOUNTER — Emergency Department (HOSPITAL_COMMUNITY): Payer: BLUE CROSS/BLUE SHIELD

## 2015-11-11 ENCOUNTER — Emergency Department (HOSPITAL_COMMUNITY)
Admission: EM | Admit: 2015-11-11 | Discharge: 2015-11-11 | Disposition: A | Discharge status: Home / Self Care | Payer: BLUE CROSS/BLUE SHIELD | Attending: Emergency Medicine | Admitting: Emergency Medicine

## 2015-11-11 DIAGNOSIS — R0789 Other chest pain: Secondary | ICD-10-CM | POA: Diagnosis not present

## 2015-11-11 DIAGNOSIS — F909 Attention-deficit hyperactivity disorder, unspecified type: Secondary | ICD-10-CM | POA: Diagnosis not present

## 2015-11-11 DIAGNOSIS — Z79899 Other long term (current) drug therapy: Secondary | ICD-10-CM | POA: Insufficient documentation

## 2015-11-11 DIAGNOSIS — Z87891 Personal history of nicotine dependence: Secondary | ICD-10-CM | POA: Diagnosis not present

## 2015-11-11 DIAGNOSIS — R079 Chest pain, unspecified: Secondary | ICD-10-CM

## 2015-11-11 HISTORY — DX: Gout, unspecified: M10.9

## 2015-11-11 LAB — BASIC METABOLIC PANEL
Anion gap: 6 (ref 5–15)
BUN: 18 mg/dL (ref 6–20)
CHLORIDE: 109 mmol/L (ref 101–111)
CO2: 23 mmol/L (ref 22–32)
Calcium: 9 mg/dL (ref 8.9–10.3)
Creatinine, Ser: 1.07 mg/dL (ref 0.61–1.24)
GFR calc Af Amer: >60 mL/min (ref >60)
GFR calc non Af Amer: >60 mL/min (ref >60)
Glucose, Bld: 90 mg/dL (ref 65–99)
POTASSIUM: 3.7 mmol/L (ref 3.5–5.1)
Sodium: 138 mmol/L (ref 135–145)

## 2015-11-11 LAB — CBC
HEMATOCRIT: 42.9 % (ref 39.0–52.0)
Hemoglobin: 15.6 g/dL (ref 13.0–17.0)
MCH: 30.9 pg (ref 26.0–34.0)
MCHC: 36.4 g/dL — AB (ref 30.0–36.0)
MCV: 85 fL (ref 78.0–100.0)
Platelets: 277 10*3/uL (ref 150–400)
RBC: 5.05 MIL/uL (ref 4.22–5.81)
RDW: 12.5 % (ref 11.5–15.5)
WBC: 9.7 10*3/uL (ref 4.0–10.5)

## 2015-11-11 LAB — I-STAT TROPONIN, ED: Troponin i, poc: 0 ng/mL (ref 0.00–0.08)

## 2015-11-11 LAB — D-DIMER, QUANTITATIVE: D-Dimer, Quant: <0.27 ug/mL-FEU (ref 0.00–0.50)

## 2015-11-11 LAB — TROPONIN I: Troponin I: <0.03 ng/mL (ref <0.031)

## 2015-11-11 MED ORDER — OXYCODONE-ACETAMINOPHEN 5-325 MG PO TABS
2.0000 | ORAL_TABLET | Freq: Once | ORAL | Status: AC
  Administered 2015-11-11: 2 via ORAL
  Filled 2015-11-11: qty 2

## 2015-11-11 MED ORDER — KETOROLAC TROMETHAMINE 60 MG/2ML IM SOLN
60.0000 mg | Freq: Once | INTRAMUSCULAR | Status: AC
  Administered 2015-11-11: 60 mg via INTRAMUSCULAR
  Filled 2015-11-11: qty 2

## 2015-11-11 MED ORDER — IBUPROFEN 800 MG PO TABS: 800.0000 mg | ORAL_TABLET | Freq: Three times a day (TID) | ORAL | Status: DC

## 2015-11-11 NOTE — ED Notes (Signed)
Pt reports R side/central CP that began yesterday after lunch. Pt denies SOB, but reports that pain does increase with deep breath. Some dizziness.

## 2015-11-11 NOTE — ED Provider Notes (Signed)
CSN: [id number]     Arrival date & time 11/11/15  1455 History   First MD Initiated Contact with Patient 11/11/15 1952     Chief Complaint  Patient presents with  . Chest Pain     (Consider location/radiation/quality/duration/timing/severity/associated sxs/prior Treatment) Patient is a 35 y.o. male presenting with chest pain.  Chest Pain Pain location:  R chest Pain quality: sharp   Pain radiates to:  Does not radiate Pain radiates to the back: no   Pain severity:  Mild Timing:  Constant Chronicity:  New Context: not breathing, not lifting, no movement and not at rest   Relieved by:  None tried Worsened by:  Nothing tried Ineffective treatments:  None tried Associated symptoms: shortness of breath     Past Medical History  Diagnosis Date  . ADHD (attention deficit hyperactivity disorder)   . Anxiety   . Gout    Past Surgical History  Procedure Laterality Date  . Cystectomy      chin   History reviewed. No pertinent family history. Social History  Substance Use Topics  . Smoking status: Former Smoker    Quit date: 02/26/2012  . Smokeless tobacco: Never Used  . Alcohol Use: No    Review of Systems  Respiratory: Positive for shortness of breath.   Cardiovascular: Positive for chest pain.  Endocrine: Negative for polydipsia and polyuria.  All other systems reviewed and are negative.     Allergies  Review of patient's allergies indicates no known allergies.  Home Medications   Prior to Admission medications   Medication Sig Start Date End Date Taking? Authorizing Provider  allopurinol (ZYLOPRIM) 100 MG tablet Take 1 tablet by mouth daily as needed (gout).  12/17/14  Yes Historical Provider, MD  allopurinol (ZYLOPRIM) 300 MG tablet Take 300 mg by mouth daily as needed (gout flare up.).    Yes Historical Provider, MD  amphetamine-dextroamphetamine (ADDERALL) 20 MG tablet Take 20 mg by mouth 3 (three) times daily.   Yes Historical Provider, MD  Colchicine  (MITIGARE) 0.6 MG CAPS Take 0.6 mg by mouth daily as needed (gout flare up.).    Yes Historical Provider, MD  ibuprofen (ADVIL,MOTRIN) 800 MG tablet Take 1 tablet (800 mg total) by mouth 3 (three) times daily. 11/11/15   Barbara CowerJason Tonatiuh Mallon, MD   BP 148/78 mmHg  Pulse 60  Temp(Src) 97.5 F (36.4 C) (Oral)  Resp 21  Ht 6\' 2"  (1.88 m)  Wt 320 lb (145.151 kg)  BMI 41.07 kg/m2  SpO2 97% Physical Exam  Constitutional: He is oriented to person, place, and time. He appears well-developed and well-nourished.  HENT:  Head: Normocephalic and atraumatic.  Neck: Normal range of motion.  Cardiovascular: Normal rate.   Pulmonary/Chest: Effort normal. No respiratory distress.  Abdominal: He exhibits no distension.  Musculoskeletal: Normal range of motion. He exhibits no edema or tenderness.  Neurological: He is alert and oriented to person, place, and time.  Skin: Skin is warm and dry.  Nursing note and vitals reviewed.   ED Course  Procedures (including critical care time) Labs Review Labs Reviewed  CBC - Abnormal; Notable for the following:    MCHC 36.4 (*)    All other components within normal limits  BASIC METABOLIC PANEL  D-DIMER, QUANTITATIVE (NOT [facility])  TROPONIN I  I-STAT TROPOININ, ED    Imaging Review Dg Chest 2 View  11/11/2015  CLINICAL DATA:  Onset of right-sided chest pain yesterday afternoon; no other chest complaints; no history of cardiopulmonary abnormality;  discontinued smoking 3 years ago. EXAM: CHEST  2 VIEW COMPARISON:  Report of a chest x-ray dated Oct 17, 2013. FINDINGS: The lungs are adequately inflated and clear. The heart and pulmonary vascularity are normal. The mediastinum is normal in width. There is no pleural effusion. The trachea is midline. The bony thorax exhibits no acute abnormality. IMPRESSION: There is no active cardiopulmonary disease. Electronically Signed   By: David  [place] M.D.   On: 11/11/2015 16:20   I have personally reviewed and evaluated these  images and lab results as part of my medical decision-making.   EKG Interpretation   Date/Time:  Wednesday November 11 2015 15:11:23 EDT Ventricular Rate:  69 PR Interval:    QRS Duration: 88 QT Interval:  389 QTC Calculation: 417 R Axis:   -38 Text Interpretation:  Sinus rhythm Left axis deviation Low voltage,  precordial leads Abnormal R-wave progression, late transition Borderline  repolarization abnormality Confirmed by Ladon Vandenberghe MD, Barbara Cower [phone]) on  11/11/2015 8:07:04 PM      MDM   Final diagnoses:  Chest pain, unspecified chest pain type    35 year old male with atypical chest pain likely musculoskeletal in origin. D-dimer negative delta troponins and EKGs without changes or evidence of ischemia. Chest x-ray without any other evidence of emergent causes. Plan for ibuprofen 3 times a day for 7 days and follow-up with primary doctor for further evaluation.  New Prescriptions: Discharge Medication List as of 11/11/2015 11:17 PM    START taking these medications   Details  ibuprofen (ADVIL,MOTRIN) 800 MG tablet Take 1 tablet (800 mg total) by mouth 3 (three) times daily., Starting 11/11/2015, Until Discontinued, Print         I have personally and contemperaneously reviewed labs and imaging and used in my decision making as above.   A medical screening exam was performed and I feel the patient has had an appropriate workup for their chief complaint at this time and likelihood of emergent condition existing is low and thus workup can continue on an outpatient basis.. Their vital signs are stable. They have been counseled on decision, discharge, follow up and which symptoms necessitate immediate return to the emergency department.  They verbally stated understanding and agreement with plan and discharged in stable condition.      Marily Memos, MD 11/12/15 [phone]

## 2016-02-09 ENCOUNTER — Encounter (HOSPITAL_COMMUNITY): Payer: Self-pay

## 2016-02-09 ENCOUNTER — Emergency Department (HOSPITAL_COMMUNITY)
Admission: EM | Admit: 2016-02-09 | Discharge: 2016-02-09 | Disposition: A | Discharge status: Home / Self Care | Payer: BLUE CROSS/BLUE SHIELD | Attending: Emergency Medicine | Admitting: Emergency Medicine

## 2016-02-09 DIAGNOSIS — Z79899 Other long term (current) drug therapy: Secondary | ICD-10-CM | POA: Insufficient documentation

## 2016-02-09 DIAGNOSIS — F909 Attention-deficit hyperactivity disorder, unspecified type: Secondary | ICD-10-CM | POA: Diagnosis not present

## 2016-02-09 DIAGNOSIS — M545 Low back pain, unspecified: Secondary | ICD-10-CM

## 2016-02-09 DIAGNOSIS — Z87891 Personal history of nicotine dependence: Secondary | ICD-10-CM | POA: Diagnosis not present

## 2016-02-09 MED ORDER — HYDROCODONE-ACETAMINOPHEN 5-325 MG PO TABS
2.0000 | ORAL_TABLET | Freq: Once | ORAL | Status: AC
  Administered 2016-02-09: 2 via ORAL
  Filled 2016-02-09: qty 2

## 2016-02-09 MED ORDER — LIDOCAINE 5 % EX PTCH
1.0000 | MEDICATED_PATCH | Freq: Once | CUTANEOUS | Status: DC
  Administered 2016-02-09: 1 via TRANSDERMAL
  Filled 2016-02-09: qty 1

## 2016-02-09 MED ORDER — METHOCARBAMOL 500 MG PO TABS: 1000.0000 mg | ORAL_TABLET | Freq: Four times a day (QID) | ORAL | 0 refills | Status: DC | PRN

## 2016-02-09 MED ORDER — KETOROLAC TROMETHAMINE 10 MG PO TABS: 10.0000 mg | ORAL_TABLET | Freq: Four times a day (QID) | ORAL | 0 refills | Status: DC | PRN

## 2016-02-09 MED ORDER — KETOROLAC TROMETHAMINE 60 MG/2ML IM SOLN
30.0000 mg | Freq: Once | INTRAMUSCULAR | Status: AC
  Administered 2016-02-09: 30 mg via INTRAMUSCULAR
  Filled 2016-02-09: qty 2

## 2016-02-09 MED ORDER — LIDOCAINE 4 % EX CREA: 1.0000 "application " | TOPICAL_CREAM | Freq: Three times a day (TID) | CUTANEOUS | 0 refills | Status: DC | PRN

## 2016-02-09 NOTE — ED Triage Notes (Signed)
Patient complains of lower back pain x 1 week, pain worse with any movement or change in position. Alert and oriented

## 2016-02-09 NOTE — Discharge Instructions (Signed)
°  Do not combine ketorolac (toradol) with any other NSAID (motrin, ibuprofen, Advil, aleve , aspirin, naproxen etc.) Take fist ketorolac pill tomorrow, you have already had a shot today.  For breakthrough pain you may take Robaxin. Do not drink alcohol, drive or operate heavy machinery when taking Robaxin.  Please follow with your primary care doctor in the next 2 days for a check-up. They must obtain records for further management.   Do not hesitate to return to the Emergency Department for any new, worsening or concerning symptoms.

## 2016-02-09 NOTE — ED Provider Notes (Signed)
[facility]-EMERGENCY DEPT Provider Note   CSN: [id number] Arrival date & time: 02/09/16  5284     History   Chief Complaint Chief Complaint  Patient presents with  . Back Pain    HPI  Blood pressure 127/64, pulse 71, temperature 98.1 F (36.7 C), temperature source Oral, resp. rate 18, height 6\' 1"  (1.854 m), weight (!) 147.4 kg, SpO2 98 %.  Bernard Lewis is a 35 y.o. 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 States that he is trying to lift appropriately with his legs. States the pain is in the left low back and doesn't radiate down the legs. He rates his pain at 10 out of 10, exacerbated by movement and standing, no pain medication taken prior to arrival. Denies fever, chills, change in bowel or bladder habits, h/o IDVU or cancer, numbness or weakness.    HPI  Past Medical History:  Diagnosis Date  . ADHD (attention deficit hyperactivity disorder)   . Anxiety   . Gout     Patient Active Problem List   Diagnosis Date Noted  . 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 CYSTECTOMY     chin       Home Medications    Prior to Admission medications   Medication Sig Start Date End Date Taking? Authorizing Provider  allopurinol (ZYLOPRIM) 100 MG tablet Take 1 tablet by mouth daily as needed (gout).  12/17/14   Historical Provider, MD  allopurinol (ZYLOPRIM) 300 MG tablet Take 300 mg by mouth daily as needed (gout flare up.).     Historical Provider, MD  amphetamine-dextroamphetamine (ADDERALL) 20 MG tablet Take 20 mg by mouth 3 (three) times daily.    Historical Provider, MD  Colchicine (MITIGARE) 0.6 MG CAPS Take 0.6 mg by mouth daily as needed (gout flare up.).     Historical Provider, MD  ibuprofen  (ADVIL,MOTRIN) 800 MG tablet Take 1 tablet (800 mg total) by mouth 3 (three) times daily. 11/11/15   Marily Memos, MD  ketorolac (TORADOL) 10 MG tablet Take 1 tablet (10 mg total) by mouth every 6 (six) hours as needed (Take with food. Do not take more than 4 per day. Do not take for longer than 5 days). 02/09/16   Anyla Israelson, PA-C  lidocaine (LMX) 4 % cream Apply 1 application topically 3 (three) times daily as needed. 02/09/16   Gerald Kuehl, PA-C  methocarbamol (ROBAXIN) 500 MG tablet Take 2 tablets (1,000 mg total) by mouth 4 (four) times daily as needed (Pain). 02/09/16   Joni Reining Aneyah Lortz, PA-C    Family History No family history on file.  Social History Social History  Substance Use Topics  . Smoking status: Former Smoker    Quit date: 02/26/2012  . Smokeless tobacco: Never Used  . Alcohol use No     Allergies   Review of patient's allergies indicates no known allergies.   Review of Systems Review of Systems   Physical Exam Updated Vital Signs BP 127/64 (BP Location: Left Arm)   Pulse 71   Temp 98.1 F (36.7 C) (Oral)   Resp 18   Ht 6\' 1"  (1.854 m)   Wt (!) 147.4 kg   SpO2 98%   BMI 42.88 kg/m   Physical Exam  Constitutional: He appears well-developed and well-nourished.  Obese  HENT:  Head: Normocephalic.  Eyes: Conjunctivae are normal.  Neck: Normal range of motion.  Cardiovascular: Normal rate, regular rhythm and intact distal pulses.   Pulmonary/Chest: Effort normal.  Abdominal: Soft. There is no tenderness.  Neurological: He is alert.  No point tenderness to percussion of lumbar spinal processes.  No TTP or paraspinal muscular spasm. Strength is 5 out of 5 to bilateral lower extremities at hip and knee; extensor hallucis longus 5 out of 5. Ankle strength 5 out of 5, no clonus, neurovascularly intact. No saddle anaesthesia. Patellar reflexes are 1+ bilaterally.      Psychiatric: He has a normal mood and affect.  Nursing note and vitals  reviewed.    ED Treatments / Results  Labs (all labs ordered are listed, but only abnormal results are displayed) Labs Reviewed - No data to display  EKG  EKG Interpretation None       Radiology No results found.  Procedures Procedures (including critical care time)  Medications Ordered in ED Medications  ketorolac (TORADOL) injection 30 mg (not administered)  lidocaine (LIDODERM) 5 % 1 patch (not administered)  HYDROcodone-acetaminophen (NORCO/VICODIN) 5-325 MG per tablet 2 tablet (not administered)     Initial Impression / Assessment and Plan / ED Course  I have reviewed the triage vital signs and the nursing notes.  Pertinent labs & imaging results that were available during my care of the patient were reviewed by me and considered in my medical decision making (see chart for details).  Clinical Course    Vitals:   02/09/16 0920 02/09/16 0921  BP: 127/64   Pulse: 71   Resp: 18   Temp: 98.1 F (36.7 C)   TempSrc: Oral   SpO2: 98%   Weight:  (!) 147.4 kg  Height:  6\' 1"  (1.854 m)    Medications  ketorolac (TORADOL) injection 30 mg (not administered)  lidocaine (LIDODERM) 5 % 1 patch (not administered)  HYDROcodone-acetaminophen (NORCO/VICODIN) 5-325 MG per tablet 2 tablet (not administered)    Bernard Lewis is 35 y.o. male presenting with back pain.  No neurological deficits and normal neuro exam.  Patient can walk but states is painful.  Slightly reduced patellar deep tendon reflexes think that secondary to muscle tension from pain. No loss of bowel or bladder control.  No concern for cauda equina.  No fever, night sweats, weight loss, h/o cancer, IVDU.  RICE protocol and pain medicine indicated and discussed with patient.  Evaluation does not show pathology that would require ongoing emergent intervention or inpatient treatment. Pt is hemodynamically stable and mentating appropriately. Discussed findings and plan with patient/guardian, who agrees  with care plan. All questions answered. Return precautions discussed and outpatient follow up given.    Final Clinical Impressions(s) / ED Diagnoses   Final diagnoses:  Left-sided low back pain without sciatica    New Prescriptions New Prescriptions   KETOROLAC (TORADOL) 10 MG TABLET    Take 1 tablet (10 mg total) by mouth every 6 (six) hours as needed (Take with food. Do not take more than 4 per day. Do not take for longer than 5 days).   LIDOCAINE (LMX) 4 % CREAM    Apply 1 application topically 3 (three) times daily as needed.   METHOCARBAMOL (ROBAXIN) 500 MG TABLET    Take 2 tablets (1,000 mg total) by mouth 4 (four) times daily as needed (Pain).     Wynetta Emery, PA-C 02/09/16 1006  Cathren LaineKevin Steinl, MD 02/09/16 [phone]

## 2016-02-09 NOTE — ED Notes (Signed)
Pt started last week with LBP. Had been working in [employer]. 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 Had increased pain.

## 2016-02-16 ENCOUNTER — Other Ambulatory Visit: Payer: Self-pay | Admitting: Neurosurgery

## 2016-02-16 DIAGNOSIS — M5126 Other intervertebral disc displacement, lumbar region: Secondary | ICD-10-CM

## 2016-02-27 ENCOUNTER — Ambulatory Visit
Admission: RE | Admit: 2016-02-27 | Discharge: 2016-02-27 | Disposition: A | Discharge status: Home / Self Care | Payer: BLUE CROSS/BLUE SHIELD | Source: Ambulatory Visit | Attending: Neurosurgery | Admitting: Neurosurgery

## 2016-02-27 DIAGNOSIS — M5126 Other intervertebral disc displacement, lumbar region: Secondary | ICD-10-CM

## 2017-06-19 ENCOUNTER — Encounter (HOSPITAL_COMMUNITY)
Admission: EM | Disposition: A | Discharge status: Home / Self Care | Payer: Self-pay | Source: Home / Self Care | Attending: Emergency Medicine

## 2017-06-19 ENCOUNTER — Observation Stay (HOSPITAL_COMMUNITY): Payer: BLUE CROSS/BLUE SHIELD | Admitting: Certified Registered Nurse Anesthetist

## 2017-06-19 ENCOUNTER — Emergency Department (HOSPITAL_COMMUNITY): Payer: BLUE CROSS/BLUE SHIELD

## 2017-06-19 ENCOUNTER — Emergency Department (HOSPITAL_BASED_OUTPATIENT_CLINIC_OR_DEPARTMENT_OTHER): Payer: BLUE CROSS/BLUE SHIELD

## 2017-06-19 ENCOUNTER — Observation Stay (HOSPITAL_BASED_OUTPATIENT_CLINIC_OR_DEPARTMENT_OTHER)
Admission: EM | Admit: 2017-06-19 | Discharge: 2017-06-20 | Disposition: A | Discharge status: Home / Self Care | Payer: BLUE CROSS/BLUE SHIELD | Attending: General Surgery | Admitting: General Surgery

## 2017-06-19 ENCOUNTER — Encounter (HOSPITAL_BASED_OUTPATIENT_CLINIC_OR_DEPARTMENT_OTHER): Payer: Self-pay | Admitting: Emergency Medicine

## 2017-06-19 ENCOUNTER — Other Ambulatory Visit: Payer: Self-pay

## 2017-06-19 DIAGNOSIS — F909 Attention-deficit hyperactivity disorder, unspecified type: Secondary | ICD-10-CM | POA: Diagnosis not present

## 2017-06-19 DIAGNOSIS — Z87891 Personal history of nicotine dependence: Secondary | ICD-10-CM | POA: Diagnosis not present

## 2017-06-19 DIAGNOSIS — K8010 Calculus of gallbladder with chronic cholecystitis without obstruction: Principal | ICD-10-CM | POA: Insufficient documentation

## 2017-06-19 DIAGNOSIS — Z6841 Body Mass Index (BMI) 40.0 and over, adult: Secondary | ICD-10-CM | POA: Insufficient documentation

## 2017-06-19 DIAGNOSIS — F419 Anxiety disorder, unspecified: Secondary | ICD-10-CM | POA: Diagnosis not present

## 2017-06-19 DIAGNOSIS — I10 Essential (primary) hypertension: Secondary | ICD-10-CM | POA: Diagnosis not present

## 2017-06-19 DIAGNOSIS — K8000 Calculus of gallbladder with acute cholecystitis without obstruction: Secondary | ICD-10-CM | POA: Diagnosis present

## 2017-06-19 DIAGNOSIS — M109 Gout, unspecified: Secondary | ICD-10-CM | POA: Diagnosis not present

## 2017-06-19 DIAGNOSIS — Z79899 Other long term (current) drug therapy: Secondary | ICD-10-CM | POA: Diagnosis not present

## 2017-06-19 DIAGNOSIS — K819 Cholecystitis, unspecified: Secondary | ICD-10-CM

## 2017-06-19 DIAGNOSIS — R1011 Right upper quadrant pain: Secondary | ICD-10-CM | POA: Diagnosis present

## 2017-06-19 HISTORY — PX: CHOLECYSTECTOMY: SHX55

## 2017-06-19 HISTORY — DX: Essential (primary) hypertension: I10

## 2017-06-19 LAB — COMPREHENSIVE METABOLIC PANEL
ALBUMIN: 3.6 g/dL (ref 3.5–5.0)
ALBUMIN: 3.4 g/dL — AB (ref 3.5–5.0)
ALT: 43 U/L (ref 17–63)
ALT: 48 U/L (ref 17–63)
ANION GAP: 7 (ref 5–15)
AST: 32 U/L (ref 15–41)
AST: 39 U/L (ref 15–41)
Alkaline Phosphatase: 79 U/L (ref 38–126)
Alkaline Phosphatase: 77 U/L (ref 38–126)
Anion gap: 9 (ref 5–15)
BUN: 15 mg/dL (ref 6–20)
BUN: 11 mg/dL (ref 6–20)
CHLORIDE: 104 mmol/L (ref 101–111)
CHLORIDE: 106 mmol/L (ref 101–111)
CO2: 25 mmol/L (ref 22–32)
CO2: 26 mmol/L (ref 22–32)
CREATININE: 1.07 mg/dL (ref 0.61–1.24)
Calcium: 8.9 mg/dL (ref 8.9–10.3)
Calcium: 8.9 mg/dL (ref 8.9–10.3)
Creatinine, Ser: 0.91 mg/dL (ref 0.61–1.24)
GFR calc Af Amer: >60 mL/min (ref >60)
GFR calc non Af Amer: >60 mL/min (ref >60)
GFR calc non Af Amer: >60 mL/min (ref >60)
GLUCOSE: 128 mg/dL — AB (ref 65–99)
GLUCOSE: 108 mg/dL — AB (ref 65–99)
POTASSIUM: 3.4 mmol/L — AB (ref 3.5–5.1)
Potassium: 3.4 mmol/L — ABNORMAL LOW (ref 3.5–5.1)
SODIUM: 138 mmol/L (ref 135–145)
Sodium: 139 mmol/L (ref 135–145)
Total Bilirubin: 0.5 mg/dL (ref 0.3–1.2)
Total Bilirubin: 0.4 mg/dL (ref 0.3–1.2)
Total Protein: 7 g/dL (ref 6.5–8.1)
Total Protein: 7 g/dL (ref 6.5–8.1)

## 2017-06-19 LAB — URINALYSIS, ROUTINE W REFLEX MICROSCOPIC
Bilirubin Urine: NEGATIVE
Glucose, UA: NEGATIVE mg/dL
Hgb urine dipstick: NEGATIVE
Ketones, ur: NEGATIVE mg/dL
Leukocytes, UA: NEGATIVE
Nitrite: NEGATIVE
PH: 6.5 (ref 5.0–8.0)
Protein, ur: NEGATIVE mg/dL
SPECIFIC GRAVITY, URINE: 1.02 (ref 1.005–1.030)

## 2017-06-19 LAB — CBC WITH DIFFERENTIAL/PLATELET
BASOS ABS: 0 10*3/uL (ref 0.0–0.1)
Basophils Relative: 0 %
Eosinophils Absolute: 0.1 10*3/uL (ref 0.0–0.7)
Eosinophils Relative: 2 %
HCT: 43.6 % (ref 39.0–52.0)
HEMOGLOBIN: 14.7 g/dL (ref 13.0–17.0)
LYMPHS PCT: 33 %
Lymphs Abs: 2.3 10*3/uL (ref 0.7–4.0)
MCH: 30 pg (ref 26.0–34.0)
MCHC: 33.7 g/dL (ref 30.0–36.0)
MCV: 89 fL (ref 78.0–100.0)
MONOS PCT: 15 %
Monocytes Absolute: 1.1 10*3/uL — ABNORMAL HIGH (ref 0.1–1.0)
NEUTROS PCT: 50 %
Neutro Abs: 3.6 10*3/uL (ref 1.7–7.7)
Platelets: 314 10*3/uL (ref 150–400)
RBC: 4.9 MIL/uL (ref 4.22–5.81)
RDW: 12.7 % (ref 11.5–15.5)
WBC: 7.1 10*3/uL (ref 4.0–10.5)

## 2017-06-19 LAB — HEPATIC FUNCTION PANEL
ALBUMIN: 3.2 g/dL — AB (ref 3.5–5.0)
ALK PHOS: 66 U/L (ref 38–126)
ALT: 40 U/L (ref 17–63)
AST: 30 U/L (ref 15–41)
Bilirubin, Direct: 0.1 mg/dL (ref 0.1–0.5)
Indirect Bilirubin: 0.6 mg/dL (ref 0.3–0.9)
TOTAL PROTEIN: 6.7 g/dL (ref 6.5–8.1)
Total Bilirubin: 0.7 mg/dL (ref 0.3–1.2)

## 2017-06-19 LAB — MRSA PCR SCREENING: MRSA BY PCR: NEGATIVE

## 2017-06-19 LAB — GLUCOSE, CAPILLARY: Glucose-Capillary: 72 mg/dL (ref 65–99)

## 2017-06-19 LAB — LIPASE, BLOOD: Lipase: 42 U/L (ref 11–51)

## 2017-06-19 SURGERY — LAPAROSCOPIC CHOLECYSTECTOMY WITH INTRAOPERATIVE CHOLANGIOGRAM
Anesthesia: General | Site: Abdomen

## 2017-06-19 MED ORDER — LIDOCAINE 2% (20 MG/ML) 5 ML SYRINGE
INTRAMUSCULAR | Status: DC | PRN
  Administered 2017-06-19: 60 mg via INTRAVENOUS

## 2017-06-19 MED ORDER — EPHEDRINE 5 MG/ML INJ
INTRAVENOUS | Status: AC
  Filled 2017-06-19: qty 10

## 2017-06-19 MED ORDER — SUCCINYLCHOLINE CHLORIDE 200 MG/10ML IV SOSY
PREFILLED_SYRINGE | INTRAVENOUS | Status: AC
Start: 2017-06-19 — End: ?
  Filled 2017-06-19: qty 10

## 2017-06-19 MED ORDER — IOPAMIDOL (ISOVUE-300) INJECTION 61%
INTRAVENOUS | Status: AC
  Filled 2017-06-19: qty 50

## 2017-06-19 MED ORDER — PROPOFOL 10 MG/ML IV BOLUS
INTRAVENOUS | Status: AC
  Filled 2017-06-19: qty 20

## 2017-06-19 MED ORDER — HYDROMORPHONE HCL 1 MG/ML IJ SOLN
1.0000 mg | Freq: Once | INTRAMUSCULAR | Status: AC
  Administered 2017-06-19: 1 mg via INTRAVENOUS
  Filled 2017-06-19: qty 1

## 2017-06-19 MED ORDER — ONDANSETRON HCL 4 MG/2ML IJ SOLN
4.0000 mg | Freq: Once | INTRAMUSCULAR | Status: AC
  Administered 2017-06-19: 4 mg via INTRAVENOUS
  Filled 2017-06-19: qty 2

## 2017-06-19 MED ORDER — MORPHINE SULFATE (PF) 4 MG/ML IV SOLN
4.0000 mg | Freq: Once | INTRAVENOUS | Status: AC
  Administered 2017-06-19: 4 mg via INTRAVENOUS
  Filled 2017-06-19: qty 1

## 2017-06-19 MED ORDER — DIPHENHYDRAMINE HCL 25 MG PO CAPS: 25.0000 mg | ORAL_CAPSULE | Freq: Four times a day (QID) | ORAL | Status: DC | PRN

## 2017-06-19 MED ORDER — ROCURONIUM BROMIDE 10 MG/ML (PF) SYRINGE
PREFILLED_SYRINGE | INTRAVENOUS | Status: DC | PRN
  Administered 2017-06-19: 40 mg via INTRAVENOUS
  Administered 2017-06-19: 20 mg via INTRAVENOUS

## 2017-06-19 MED ORDER — FENTANYL CITRATE (PF) 100 MCG/2ML IJ SOLN
INTRAMUSCULAR | Status: DC | PRN
  Administered 2017-06-19 (×3): 50 ug via INTRAVENOUS

## 2017-06-19 MED ORDER — ONDANSETRON HCL 4 MG/2ML IJ SOLN
INTRAMUSCULAR | Status: AC
  Filled 2017-06-19: qty 2

## 2017-06-19 MED ORDER — ONDANSETRON 4 MG PO TBDP: 4.0000 mg | ORAL_TABLET | Freq: Four times a day (QID) | ORAL | Status: DC | PRN

## 2017-06-19 MED ORDER — KETOROLAC TROMETHAMINE 30 MG/ML IJ SOLN
30.0000 mg | Freq: Three times a day (TID) | INTRAMUSCULAR | Status: DC
  Administered 2017-06-19 – 2017-06-20 (×2): 30 mg via INTRAVENOUS
  Filled 2017-06-19 (×2): qty 1

## 2017-06-19 MED ORDER — DEXTROSE 5 % IV SOLN
3.0000 g | Freq: Once | INTRAVENOUS | Status: AC
  Administered 2017-06-19: 3 g via INTRAVENOUS
  Filled 2017-06-19: qty 3

## 2017-06-19 MED ORDER — LACTATED RINGERS IR SOLN
Status: DC | PRN
  Administered 2017-06-19: 1000 mL

## 2017-06-19 MED ORDER — MIDAZOLAM HCL 5 MG/5ML IJ SOLN
INTRAMUSCULAR | Status: DC | PRN
  Administered 2017-06-19: 2 mg via INTRAVENOUS

## 2017-06-19 MED ORDER — PROMETHAZINE HCL 25 MG/ML IJ SOLN: 6.2500 mg | INTRAMUSCULAR | Status: DC | PRN

## 2017-06-19 MED ORDER — DEXTROSE IN LACTATED RINGERS 5 % IV SOLN: INTRAVENOUS | Status: DC

## 2017-06-19 MED ORDER — CEFAZOLIN SODIUM-DEXTROSE 2-4 GM/100ML-% IV SOLN
INTRAVENOUS | Status: AC
  Filled 2017-06-19: qty 100

## 2017-06-19 MED ORDER — ALLOPURINOL 300 MG PO TABS
300.0000 mg | ORAL_TABLET | Freq: Every day | ORAL | Status: DC
  Administered 2017-06-19 – 2017-06-20 (×2): 300 mg via ORAL
  Filled 2017-06-19 (×2): qty 1

## 2017-06-19 MED ORDER — LACTATED RINGERS IV SOLN
INTRAVENOUS | Status: DC
  Administered 2017-06-19 (×2): via INTRAVENOUS

## 2017-06-19 MED ORDER — LIDOCAINE 2% (20 MG/ML) 5 ML SYRINGE
INTRAMUSCULAR | Status: AC
  Filled 2017-06-19: qty 5

## 2017-06-19 MED ORDER — HYDROCHLOROTHIAZIDE 10 MG/ML ORAL SUSPENSION
6.2500 mg | Freq: Every day | ORAL | Status: DC
  Administered 2017-06-19 – 2017-06-20 (×2): 6.25 mg via ORAL
  Filled 2017-06-19 (×5): qty 1.25

## 2017-06-19 MED ORDER — 0.9 % SODIUM CHLORIDE (POUR BTL) OPTIME
TOPICAL | Status: DC | PRN
  Administered 2017-06-19: 1000 mL

## 2017-06-19 MED ORDER — DEXTROSE 5 % IV SOLN
2.0000 g | INTRAVENOUS | Status: DC
  Administered 2017-06-19: 2 g via INTRAVENOUS
  Filled 2017-06-19: qty 2

## 2017-06-19 MED ORDER — DIPHENHYDRAMINE HCL 50 MG/ML IJ SOLN
INTRAMUSCULAR | Status: AC
  Filled 2017-06-19: qty 1

## 2017-06-19 MED ORDER — HYDROMORPHONE HCL 1 MG/ML IJ SOLN: 0.2500 mg | INTRAMUSCULAR | Status: DC | PRN

## 2017-06-19 MED ORDER — HYDROMORPHONE HCL 1 MG/ML IJ SOLN
INTRAMUSCULAR | Status: AC
  Filled 2017-06-19: qty 1

## 2017-06-19 MED ORDER — EPHEDRINE SULFATE-NACL 50-0.9 MG/10ML-% IV SOSY
PREFILLED_SYRINGE | INTRAVENOUS | Status: DC | PRN
  Administered 2017-06-19 (×3): 10 mg via INTRAVENOUS

## 2017-06-19 MED ORDER — DEXTROSE 5 % IV SOLN
1.0000 g | Freq: Once | INTRAVENOUS | Status: AC
  Administered 2017-06-19: 1 g via INTRAVENOUS
  Filled 2017-06-19: qty 10

## 2017-06-19 MED ORDER — BUPIVACAINE-EPINEPHRINE (PF) 0.5% -1:200000 IJ SOLN
INTRAMUSCULAR | Status: DC | PRN
  Administered 2017-06-19: 20 mL

## 2017-06-19 MED ORDER — OXYCODONE HCL 5 MG PO TABS: 5.0000 mg | ORAL_TABLET | ORAL | Status: DC | PRN

## 2017-06-19 MED ORDER — HYDROMORPHONE HCL 1 MG/ML IJ SOLN
1.0000 mg | INTRAMUSCULAR | Status: DC | PRN
  Administered 2017-06-19 (×2): 1 mg via INTRAVENOUS
  Filled 2017-06-19 (×2): qty 1

## 2017-06-19 MED ORDER — SODIUM CHLORIDE 0.9 % IV SOLN
INTRAVENOUS | Status: DC
  Administered 2017-06-19: 18:00:00 via INTRAVENOUS

## 2017-06-19 MED ORDER — FENTANYL CITRATE (PF) 250 MCG/5ML IJ SOLN
INTRAMUSCULAR | Status: AC
  Filled 2017-06-19: qty 5

## 2017-06-19 MED ORDER — ACETAMINOPHEN 500 MG PO TABS
1000.0000 mg | ORAL_TABLET | Freq: Three times a day (TID) | ORAL | Status: DC
  Administered 2017-06-19 – 2017-06-20 (×3): 1000 mg via ORAL
  Filled 2017-06-19 (×3): qty 2

## 2017-06-19 MED ORDER — SUGAMMADEX SODIUM 200 MG/2ML IV SOLN
INTRAVENOUS | Status: DC | PRN
  Administered 2017-06-19: 400 mg via INTRAVENOUS

## 2017-06-19 MED ORDER — PROPOFOL 10 MG/ML IV BOLUS
INTRAVENOUS | Status: DC | PRN
  Administered 2017-06-19: 200 mg via INTRAVENOUS

## 2017-06-19 MED ORDER — ROCURONIUM BROMIDE 50 MG/5ML IV SOSY
PREFILLED_SYRINGE | INTRAVENOUS | Status: AC
  Filled 2017-06-19: qty 5

## 2017-06-19 MED ORDER — MIDAZOLAM HCL 2 MG/2ML IJ SOLN
INTRAMUSCULAR | Status: AC
  Filled 2017-06-19: qty 2

## 2017-06-19 MED ORDER — LISINOPRIL 5 MG PO TABS
2.5000 mg | ORAL_TABLET | Freq: Every day | ORAL | Status: DC
  Administered 2017-06-19 – 2017-06-20 (×2): 2.5 mg via ORAL
  Filled 2017-06-19 (×2): qty 1

## 2017-06-19 MED ORDER — SODIUM CHLORIDE 0.9 % IV BOLUS (SEPSIS)
1000.0000 mL | Freq: Once | INTRAVENOUS | Status: AC
  Administered 2017-06-19: 1000 mL via INTRAVENOUS

## 2017-06-19 MED ORDER — DEXAMETHASONE SODIUM PHOSPHATE 10 MG/ML IJ SOLN
INTRAMUSCULAR | Status: AC
  Filled 2017-06-19: qty 1

## 2017-06-19 MED ORDER — MORPHINE SULFATE (PF) 2 MG/ML IV SOLN
1.0000 mg | INTRAVENOUS | Status: DC | PRN
  Filled 2017-06-19: qty 1

## 2017-06-19 MED ORDER — BUPIVACAINE-EPINEPHRINE (PF) 0.5% -1:200000 IJ SOLN
INTRAMUSCULAR | Status: AC
  Filled 2017-06-19: qty 30

## 2017-06-19 MED ORDER — DIPHENHYDRAMINE HCL 50 MG/ML IJ SOLN
25.0000 mg | Freq: Four times a day (QID) | INTRAMUSCULAR | Status: DC | PRN
  Administered 2017-06-19: 12.5 mg via INTRAVENOUS

## 2017-06-19 MED ORDER — OXYCODONE HCL 5 MG PO TABS
5.0000 mg | ORAL_TABLET | ORAL | Status: DC | PRN
  Administered 2017-06-19: 5 mg via ORAL
  Administered 2017-06-20: 10 mg via ORAL
  Filled 2017-06-19: qty 1
  Filled 2017-06-19: qty 2

## 2017-06-19 MED ORDER — SUCCINYLCHOLINE CHLORIDE 200 MG/10ML IV SOSY
PREFILLED_SYRINGE | INTRAVENOUS | Status: DC | PRN
  Administered 2017-06-19: 180 mg via INTRAVENOUS

## 2017-06-19 MED ORDER — DEXAMETHASONE SODIUM PHOSPHATE 4 MG/ML IJ SOLN
INTRAMUSCULAR | Status: DC | PRN
  Administered 2017-06-19: 10 mg via INTRAVENOUS

## 2017-06-19 MED ORDER — ONDANSETRON HCL 4 MG/2ML IJ SOLN
4.0000 mg | Freq: Four times a day (QID) | INTRAMUSCULAR | Status: DC | PRN
  Administered 2017-06-19: 4 mg via INTRAVENOUS

## 2017-06-19 MED ORDER — SUGAMMADEX SODIUM 500 MG/5ML IV SOLN
INTRAVENOUS | Status: AC
  Filled 2017-06-19: qty 5

## 2017-06-19 SURGICAL SUPPLY — 45 items
ADH SKN CLS APL DERMABOND .7 (GAUZE/BANDAGES/DRESSINGS) ×1
APL SKNCLS STERI-STRIP NONHPOA (GAUZE/BANDAGES/DRESSINGS)
APPLIER CLIP 5 13 M/L LIGAMAX5 (MISCELLANEOUS) ×2
APPLIER CLIP ROT 10 11.4 M/L (STAPLE)
APR CLP MED LRG 11.4X10 (STAPLE)
APR CLP MED LRG 5 ANG JAW (MISCELLANEOUS) ×1
BAG SPEC RTRVL 10 TROC 200 (ENDOMECHANICALS) ×1
BENZOIN TINCTURE PRP APPL 2/3 (GAUZE/BANDAGES/DRESSINGS) IMPLANT
CABLE HIGH FREQUENCY MONO STRZ (ELECTRODE) ×2 IMPLANT
CHLORAPREP W/TINT 26ML (MISCELLANEOUS) ×2 IMPLANT
CLIP APPLIE 5 13 M/L LIGAMAX5 (MISCELLANEOUS) ×1 IMPLANT
CLIP APPLIE ROT 10 11.4 M/L (STAPLE) IMPLANT
COVER MAYO STAND STRL (DRAPES) IMPLANT
COVER SURGICAL LIGHT HANDLE (MISCELLANEOUS) ×2 IMPLANT
DECANTER SPIKE VIAL GLASS SM (MISCELLANEOUS) ×2 IMPLANT
DERMABOND ADVANCED (GAUZE/BANDAGES/DRESSINGS) ×1
DERMABOND ADVANCED .7 DNX12 (GAUZE/BANDAGES/DRESSINGS) ×1 IMPLANT
DEVICE TROCAR PUNCTURE CLOSURE (ENDOMECHANICALS) ×2 IMPLANT
DRAPE C-ARM 42X120 X-RAY (DRAPES) IMPLANT
ELECT REM PT RETURN 15FT ADLT (MISCELLANEOUS) ×2 IMPLANT
GLOVE BIO SURGEON STRL SZ7 (GLOVE) ×2 IMPLANT
GLOVE BIOGEL PI IND STRL 7.5 (GLOVE) ×1 IMPLANT
GLOVE BIOGEL PI INDICATOR 7.5 (GLOVE) ×1
GOWN STRL REUS W/TWL LRG LVL3 (GOWN DISPOSABLE) ×2 IMPLANT
GOWN STRL REUS W/TWL XL LVL3 (GOWN DISPOSABLE) ×4 IMPLANT
HEMOSTAT SNOW SURGICEL 2X4 (HEMOSTASIS) IMPLANT
KIT BASIN OR (CUSTOM PROCEDURE TRAY) ×2 IMPLANT
POSITIONER SURGICAL ARM (MISCELLANEOUS) IMPLANT
POUCH RETRIEVAL ECOSAC 10 (ENDOMECHANICALS) ×1 IMPLANT
POUCH RETRIEVAL ECOSAC 10MM (ENDOMECHANICALS) ×1
SCISSORS METZENBAUM CVD 33 (INSTRUMENTS) ×2 IMPLANT
SET CHOLANGIOGRAPH MIX (MISCELLANEOUS) IMPLANT
SET IRRIG TUBING LAPAROSCOPIC (IRRIGATION / IRRIGATOR) ×2 IMPLANT
SLEEVE XCEL OPT CAN 5 100 (ENDOMECHANICALS) ×4 IMPLANT
STRIP CLOSURE SKIN 1/2X4 (GAUZE/BANDAGES/DRESSINGS) ×2 IMPLANT
SUT MNCRL AB 4-0 PS2 18 (SUTURE) ×4 IMPLANT
SUT VICRYL 0 TIES 12 18 (SUTURE) IMPLANT
SUT VICRYL 0 UR6 27IN ABS (SUTURE) ×4 IMPLANT
TOWEL OR 17X26 10 PK STRL BLUE (TOWEL DISPOSABLE) ×2 IMPLANT
TOWEL OR NON WOVEN STRL DISP B (DISPOSABLE) ×2 IMPLANT
TRAY LAPAROSCOPIC (CUSTOM PROCEDURE TRAY) ×2 IMPLANT
TROCAR BLADELESS OPT 5 100 (ENDOMECHANICALS) ×2 IMPLANT
TROCAR XCEL BLUNT TIP 100MML (ENDOMECHANICALS) ×2 IMPLANT
TROCAR XCEL NON-BLD 11X100MML (ENDOMECHANICALS) IMPLANT
TUBING INSUF HEATED (TUBING) ×2 IMPLANT

## 2017-06-19 NOTE — Anesthesia Preprocedure Evaluation (Addendum)
Anesthesia Evaluation  Patient identified by MRN, date of birth, ID band Patient awake    Reviewed: Allergy & Precautions, NPO status , Patient's Chart, lab work & pertinent test results  History of Anesthesia Complications Negative for: history of anesthetic complications  Airway Mallampati: II       Dental  (+) Dental Advisory Given, Upper Dentures, Lower Dentures   Pulmonary former smoker,    Pulmonary exam normal        Cardiovascular hypertension, Pt. on medications negative cardio ROS Normal cardiovascular exam     Neuro/Psych PSYCHIATRIC DISORDERS Anxiety negative neurological ROS  negative psych ROS   GI/Hepatic Neg liver ROS,   Endo/Other  Morbid obesity  Renal/GU negative Renal ROS  negative genitourinary   Musculoskeletal negative musculoskeletal ROS (+)   Abdominal   Peds negative pediatric ROS (+)  Hematology negative hematology ROS (+)   Anesthesia Other Findings   Reproductive/Obstetrics negative OB ROS                            Anesthesia Physical Anesthesia Plan  ASA: III  Anesthesia Plan: General   Post-op Pain Management:    Induction: Intravenous, Rapid sequence and Cricoid pressure planned  PONV Risk Score and Plan: 3 and Ondansetron, Dexamethasone and Diphenhydramine  Airway Management Planned: Oral ETT  Additional Equipment:   Intra-op Plan:   Post-operative Plan: Extubation in OR  Informed Consent: I have reviewed the patients History and Physical, chart, labs and discussed the procedure including the risks, benefits and alternatives for the proposed anesthesia with the patient or authorized representative who has indicated his/her understanding and acceptance.   Dental advisory given  Plan Discussed with: CRNA and Anesthesiologist  Anesthesia Plan Comments:         Anesthesia Quick Evaluation

## 2017-06-19 NOTE — ED Notes (Signed)
Returned from CT.

## 2017-06-19 NOTE — Anesthesia Procedure Notes (Signed)
Procedure Name: Intubation Date/Time: 06/19/2017 12:24 PM Performed by: Vanessa Durhamochran, Artavia Jeanlouis Glenn, CRNA Pre-anesthesia Checklist: Patient identified, Emergency Drugs available, Suction available and Patient being monitored Patient Re-evaluated:Patient Re-evaluated prior to induction Oxygen Delivery Method: Circle system utilized Preoxygenation: Pre-oxygenation with 100% oxygen Induction Type: IV induction Ventilation: Mask ventilation without difficulty Laryngoscope Size: 2 and Miller Grade View: Grade I Tube type: Oral Tube size: 8.0 mm Number of attempts: 1 Airway Equipment and Method: Stylet Placement Confirmation: ETT inserted through vocal cords under direct vision,  positive ETCO2 and breath sounds checked- equal and bilateral Secured at: 23 cm Tube secured with: Tape Dental Injury: Teeth and Oropharynx as per pre-operative assessment

## 2017-06-19 NOTE — Discharge Instructions (Signed)
[facility] LAPAROSCOPIC SURGERY: POST OP INSTRUCTIONS  Always review your discharge instruction sheet given to you by the facility where your surgery was performed. IF YOU HAVE DISABILITY OR FAMILY LEAVE FORMS, YOU MUST BRING THEM TO THE OFFICE FOR PROCESSING.   DO NOT GIVE THEM TO YOUR DOCTOR.  1. A prescription for pain medication may be given to you upon discharge.  Take your pain medication as prescribed, if needed.  If narcotic pain medicine is not needed, then you may take acetaminophen (Tylenol), naprosyn (Alleve), or ibuprofen (Advil) as needed. 2. Take your usually prescribed medications unless otherwise directed. 3. If you need a refill on your pain medication, please contact your pharmacy.  They will contact our office to request authorization. Prescriptions will not be filled after 5pm or on week-ends. 4. You should follow a light diet the first few days after arrival home, such as soup and crackers, etc.  Be sure to include lots of fluids daily. 5. Most patients will experience some swelling and bruising in the area of the incisions.  Ice packs will help.  Swelling and bruising can take several days to resolve.  6. It is common to experience some constipation if taking pain medication after surgery.  Increasing fluid intake and taking a stool softener (such as Colace) will usually help or prevent this problem from occurring.  A mild laxative (Milk of Magnesia or Miralax) should be taken according to package instructions if there are no bowel movements after 48 hours. 7. Unless discharge instructions indicate otherwise, you may remove your bandages 48 hours after surgery, and you may shower at that time.  You may have steri-strips (small skin tapes) in place directly over the incision.  These strips should be left on the skin for 7-10 days.  If your surgeon used skin glue on the incision, you may shower in 24 hours.  The glue will flake  off over the next 2-3 weeks.  Any sutures or staples will be removed at the office during your follow-up visit. 8. ACTIVITIES:  You may resume regular (light) daily activities beginning the next day--such as daily self-care, walking, climbing stairs--gradually increasing activities as tolerated.  You may have sexual intercourse when it is comfortable.  Refrain from any heavy lifting or straining until approved by your doctor. a. You may drive when you are no longer taking prescription pain medication, you can comfortably wear a seatbelt, and you can safely maneuver your car and apply brakes. b. RETURN TO WORK:  __________________________________________________________ 9. You should see your doctor in the office for a follow-up appointment approximately 2-3 weeks after your surgery.  Make sure that you call for this appointment within a day or two after you arrive home to insure a convenient appointment time. 10. OTHER INSTRUCTIONS: __________________________________________________________________________________________________________________________ __________________________________________________________________________________________________________________________ WHEN TO CALL YOUR DOCTOR: 1. Fever over 101.0 2. Inability to urinate 3. Continued bleeding from incision. 4. Increased pain, redness, or drainage from the incision. 5. Increasing abdominal pain  The clinic staff is available to answer your questions during regular business hours.  Please don't hesitate to call and ask to speak to one of the nurses for clinical concerns.  If you have a medical emergency, go to the nearest emergency room or call 911.  A surgeon from [facility] is always on call at the hospital. [address], [address], [place], [place]  [address] ? [address] [address], [place], [place]   [address] [phone] ? [phone] ? [phone]   [phone] Web site: [email or web address]  

## 2017-06-19 NOTE — ED Notes (Signed)
Patient transported to CT 

## 2017-06-19 NOTE — ED Provider Notes (Signed)
[facility] EMERGENCY DEPARTMENT Provider Note   CSN: [id number] Arrival date & time: 06/19/17  0053     History   Chief Complaint Chief Complaint  Patient presents with  . Abdominal Pain    HPI Bernard Lewis is a 37 y.o. male.  HPI  This is a 37 year old male with a history of hypertension, ADHD who presents with right upper quadrant pain.  Patient reports onset of symptoms last night at 9 PM.  He reports sharp right upper quadrant pain.  He rates the pain at 10 out of 10.  It is nonradiating.  Reports nausea without vomiting or diarrhea.  Denies any hematuria or dysuria.  He has had similar pain in the past and states "I had some sort of stone and they gave me a strainer."  He is unable to tell me whether he thinks he had a kidney stone or gallstone.  He denies any fevers or recent infectious symptoms.  Patient denies that he ate anything new for dinner.  Nothing seems to make the pain better or worse.  Past Medical History:  Diagnosis Date  . ADHD (attention deficit hyperactivity disorder)   . Anxiety   . Gout   . Hypertension     Patient Active Problem List   Diagnosis Date Noted  . 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 CYSTECTOMY     chin       Home Medications    Prior to Admission medications   Medication Sig Start Date End Date Taking? Authorizing Provider  allopurinol (ZYLOPRIM) 100 MG tablet Take 1 tablet by mouth daily as needed (gout).  12/17/14   [provider]  allopurinol (ZYLOPRIM) 300 MG tablet Take 300 mg by mouth daily as needed (gout flare up.).     [provider]  amphetamine-dextroamphetamine (ADDERALL) 20 MG tablet Take 20 mg by mouth 3 (three) times daily.    [provider]  Colchicine (MITIGARE) 0.6 MG CAPS Take 0.6 mg by mouth daily as needed (gout flare up.).     [provider]  ibuprofen (ADVIL,MOTRIN) 800  MG tablet Take 1 tablet (800 mg total) by mouth 3 (three) times daily. 11/11/15   Mesner, Barbara Cower, MD  ketorolac (TORADOL) 10 MG tablet Take 1 tablet (10 mg total) by mouth every 6 (six) hours as needed (Take with food. Do not take more than 4 per day. Do not take for longer than 5 days). 02/09/16   Pisciotta, Joni Reining, PA-C  lidocaine (LMX) 4 % cream Apply 1 application topically 3 (three) times daily as needed. 02/09/16   Pisciotta, Joni Reining, PA-C  methocarbamol (ROBAXIN) 500 MG tablet Take 2 tablets (1,000 mg total) by mouth 4 (four) times daily as needed (Pain). 02/09/16   Pisciotta, Mardella Layman    Family History No family history on file.  Social History Social History   Tobacco Use  . Smoking status: Former Smoker    Last attempt to quit: 02/26/2012    Years since quitting: 5.3  . Smokeless tobacco: Never Used  Substance Use Topics  . Alcohol use: No  . Drug use: No     Allergies   Patient has no known allergies.   Review of Systems Review of Systems  Constitutional: Negative for fever.  Respiratory: Negative for shortness of breath.   Cardiovascular: Negative for chest pain.  Gastrointestinal: Positive for abdominal pain and nausea. Negative for constipation and vomiting.  Genitourinary:  Negative for dysuria, flank pain and hematuria.  All other systems reviewed and are negative.    Physical Exam Updated Vital Signs BP 139/79   Pulse (!) 53   Temp 97.8 F (36.6 C) (Oral)   Resp 20   SpO2 96%   Physical Exam  Constitutional: He is oriented to person, place, and time. He appears well-developed and well-nourished.  Obese, no acute distress  HENT:  Head: Normocephalic and atraumatic.  Neck: Neck supple.  Cardiovascular: Normal rate, regular rhythm and normal heart sounds.  No murmur heard. Pulmonary/Chest: Effort normal and breath sounds normal. No respiratory distress. He has no wheezes.  Abdominal: Soft. Bowel sounds are normal. There is tenderness in the right  upper quadrant. There is positive Murphy's sign. There is no rebound.  Right upper quadrant tenderness to palpation without rebound or guarding  Genitourinary:  Genitourinary Comments: No CVA tenderness  Musculoskeletal: He exhibits no edema.  Lymphadenopathy:    He has no cervical adenopathy.  Neurological: He is alert and oriented to person, place, and time.  Skin: Skin is warm and dry.  Psychiatric: He has a normal mood and affect.  Nursing note and vitals reviewed.    ED Treatments / Results  Labs (all labs ordered are listed, but only abnormal results are displayed) Labs Reviewed  CBC WITH DIFFERENTIAL/PLATELET - Abnormal; Notable for the following components:      Result Value   Monocytes Absolute 1.1 (*)    All other components within normal limits  COMPREHENSIVE METABOLIC PANEL - Abnormal; Notable for the following components:   Potassium 3.4 (*)    Glucose, Bld 128 (*)    All other components within normal limits  LIPASE, BLOOD  URINALYSIS, ROUTINE W REFLEX MICROSCOPIC    EKG  EKG Interpretation None       Radiology Ct Renal Stone Study  Result Date: 06/19/2017 CLINICAL DATA:  Right upper quadrant and right lower quadrant pain with onset around 6:08 p.m. Nausea and diarrhea. EXAM: CT ABDOMEN AND PELVIS WITHOUT CONTRAST TECHNIQUE: Multidetector CT imaging of the abdomen and pelvis was performed following the standard protocol without IV contrast. COMPARISON:  None. FINDINGS: Lower chest: Lung bases are clear. Hepatobiliary: Cholelithiasis with multiple stones in the gallbladder. Mild gallbladder wall thickening and edema may indicate cholecystitis. No focal liver lesions. No bile duct dilatation. Pancreas: Unremarkable. No pancreatic ductal dilatation or surrounding inflammatory changes. Spleen: Normal in size without focal abnormality. Adrenals/Urinary Tract: Adrenal glands are unremarkable. Kidneys are normal, without renal calculi, focal lesion, or hydronephrosis.  Bladder is unremarkable. Stomach/Bowel: Stomach, small bowel, and colon are not abnormally distended. No inflammatory changes are demonstrated. Appendix is not visualized. Vascular/Lymphatic: No significant vascular findings are present. No enlarged abdominal or pelvic lymph nodes. Reproductive: Prostate is unremarkable. Other: No abdominal wall hernia or abnormality. No abdominopelvic ascites. Musculoskeletal: Mild degenerative changes in the spine. No destructive bone lesions. IMPRESSION: 1. Cholelithiasis with gallbladder wall thickening and edema suggesting acute cholecystitis. 2. No evidence of bowel obstruction or inflammation. Electronically Signed   By: Burman NievesWilliam  Stevens M.D.   On: 06/19/2017 02:06    Procedures Procedures (including critical care time)  Medications Ordered in ED Medications  sodium chloride 0.9 % bolus 1,000 mL (not administered)  morphine 4 MG/ML injection 4 mg (not administered)  cefTRIAXone (ROCEPHIN) 1 g in dextrose 5 % 50 mL IVPB (not administered)  morphine 4 MG/ML injection 4 mg (4 mg Intravenous Given 06/19/17 0149)  ondansetron (ZOFRAN) injection 4 mg (4 mg Intravenous  Given 06/19/17 0146)     Initial Impression / Assessment and Plan / ED Course  I have reviewed the triage vital signs and the nursing notes.  Pertinent labs & imaging results that were available during my care of the patient were reviewed by me and considered in my medical decision making (see chart for details).    Patient presents with right upper quadrant pain.  He is uncomfortable appearing on exam but nontoxic.  Vital signs reassuring.  Afebrile.  He does have reproducible right upper quadrant tenderness.  This is most suggestive of gallbladder pathology.  He reports history of stone but is unable to tell me whether was a kidney stone or gallstone.  If he was given a strainer, suspect he had a kidney stone previously.  Regardless, given tenderness, patient was given pain and nausea medication.   Noncontrasted CT scan was obtained as I believe patient's weight will likely provide any enhancement needed for soft organs.  CT scan is concerning for acute cholecystitis with pericholecystic fluid and gallbladder wall thickening in the setting of cholelithiasis.  On recheck, patient has some persistent right upper quadrant pain.  2:55 AM Discussed with Dr. Maisie Fus, general surgery.  She recommends transferring to [facility] for ultrasound.  After ultrasound obtained, page Dr. Maisie Fus.  3:20 AM Discussed with Dr. Blinda Leatherwood who has accepted the patient in transfer to [facility].  Patient was given 1 dose of IV Rocephin prior to transfer.  Final Clinical Impressions(s) / ED Diagnoses   Final diagnoses:  Cholecystitis    ED Discharge Orders    None       Horton, Mayer Masker, MD 06/19/17 [phone]

## 2017-06-19 NOTE — ED Notes (Signed)
MD aware that pt is requesting further pain medications. Orders received.

## 2017-06-19 NOTE — Op Note (Signed)
Preoperative diagnosis:acute cholecystitis Postoperative diagnosis:same as above Procedure: Laparoscopic cholecystectomy Surgeon: Dr. Harden MoMatt Ranvir Lewis Asst: Zola ButtonWill Jennings PA-C Anesthesia: Gen. Specimens: gb to pathology Estimated blood loss: minimal Complications: None Drains: none Sponge count was correct at completion Disposition to recovery stable  Indications: This is a [age] yom who presents with right upper quadrant pain and cholelithiasis on ultrasound.  I discussed lap chole today with him.   Procedure: After informed consent was obtained the patient was taken to the operating room. He was givenantibiotics. SCDs were in place. He was placed undergeneral anesthesia without complication. His abdomen was prepped and draped in the standard sterile surgical fashion. A surgical timeout was then performed.  I infiltrated marcainebelow the umbilicus. Imade an incisionand identified the fascia. I entered the fascia and incised it. I entered the peritoneum bluntly. I placed a 0 vicryl pursestring suture. I inserted a hasson trocar and insufflated the to 15 mm Hg pressure.  I then placed a 5 mm trocar in theepigastrium.Two5 mm trocars were placed in the right side of the abdomen.the gallbladder did haveacitecholecystitis.Daisy Lazar.Igrasped the gallbladder and retracted cephalad and lateral.EventuallyI was able to identify the critical view of safety.I then clipped the duct and divided it. The duct was viable. The clips traversed the duct. I then clipped and divided the cystic artery.I then removed the gallbladder from the liver bed. I placed the gallbladderin a bag and removed from the umbilicus. I obtained hemostasis.I then removed the hasson trocar, tied down the pursestringand closed this with multiple2-0 vicrylsusing the endoclose device.I then removed the remaining trocars and these were closed with 4-0 Monocryl and glue. He tolerated this well be transferred to the  recovery room.

## 2017-06-19 NOTE — ED Notes (Signed)
ED Provider at bedside. 

## 2017-06-19 NOTE — ED Notes (Signed)
Ultrasound at the bedside

## 2017-06-19 NOTE — H&P (Signed)
Bernard Lewis is an 37 y.o. male.    Chief Complaint: Right upper quadrant/right lower quadrant pain that started last evening with nausea and diarrhea  HPI: 37 year old male who presented to the [place] acute onset of right hip pain last evening.  Symptoms started around 8 PM.  He rated his pain is 10/10 he has had similar pains in the past renal stones.  Workup in the ED there shows he is afebrile vital signs are stable.  Labs shows essentially normal CMP lipase is 42, LFTs are normal.  WBC is 7.1, hemoglobin 14.7, hematocrit 43.6, platelets 314,000.  Urinalysis is clear, negative nitrates.  CT scan showed cholelithiasis with gallbladder wall thickening and edema suggesting acute cholecystitis no evidence of bowel obstruction or inflammation.  Gallbladder ultrasound shows cholelithiasis with significant gallbladder wall thickening.  No pericholecystic fluid, common bile duct was 3 mm and normal.  He was transferred to [facility] for further evaluation and treatment. No family hx of irritable bowel disease.     Past Medical History:  Diagnosis Date  . ADHD (attention deficit hyperactivity disorder)   . Anxiety   . Gout   . Hypertension     Past Surgical History:  Procedure Laterality Date  . CYSTECTOMY     chin    No family history on file. Social History:  reports that he quit smoking about 5 years ago. he has never used smokeless tobacco. He reports that he does not drink alcohol or use drugs. Tobacco  14 years, <1PPD, quit 2013 Etoh social  Drugs- none Works in a warehouse, Programmer, systems most of the time.      Allergies: No Known Allergies  Prior to Admission medications   Medication Sig Start Date End Date Taking? Authorizing Provider  allopurinol (ZYLOPRIM) 100 MG tablet Take 1 tablet by mouth daily as needed (gout).  12/17/14   [provider]  allopurinol (ZYLOPRIM) 300 MG tablet Take 300 mg by mouth daily as needed (gout flare  up.).     [provider]  amphetamine-dextroamphetamine (ADDERALL) 20 MG tablet Take 20 mg by mouth 3 (three) times daily.    [provider]  Colchicine (MITIGARE) 0.6 MG CAPS Take 0.6 mg by mouth daily as needed (gout flare up.).     [provider]  ibuprofen (ADVIL,MOTRIN) 800 MG tablet Take 1 tablet (800 mg total) by mouth 3 (three) times daily. 11/11/15   Mesner, Corene Cornea, MD  ketorolac (TORADOL) 10 MG tablet Take 1 tablet (10 mg total) by mouth every 6 (six) hours as needed (Take with food. Do not take more than 4 per day. Do not take for longer than 5 days). 02/09/16   Pisciotta, Elmyra Ricks, PA-C  lidocaine (LMX) 4 % cream Apply 1 application topically 3 (three) times daily as needed. 02/09/16   Pisciotta, Elmyra Ricks, PA-C  methocarbamol (ROBAXIN) 500 MG tablet Take 2 tablets (1,000 mg total) by mouth 4 (four) times daily as needed (Pain). 02/09/16   Pisciotta, Elmyra Ricks, PA-C     Results for orders placed or performed during the hospital encounter of 06/19/17 (from the past 48 hour(s))  CBC with Differential     Status: Abnormal   Collection Time: 06/19/17  1:47 AM  Result Value Ref Range   WBC 7.1 4.0 - 10.5 K/uL   RBC 4.90 4.22 - 5.81 MIL/uL   Hemoglobin 14.7 13.0 - 17.0 g/dL   HCT 43.6 39.0 - 52.0 %   MCV 89.0 78.0 - 100.0  fL   MCH 30.0 26.0 - 34.0 pg   MCHC 33.7 30.0 - 36.0 g/dL   RDW 12.7 11.5 - 15.5 %   Platelets 314 150 - 400 K/uL   Neutrophils Relative % 50 %   Lymphocytes Relative 33 %   Monocytes Relative 15 %   Eosinophils Relative 2 %   Basophils Relative 0 %   Neutro Abs 3.6 1.7 - 7.7 K/uL   Lymphs Abs 2.3 0.7 - 4.0 K/uL   Monocytes Absolute 1.1 (H) 0.1 - 1.0 K/uL   Eosinophils Absolute 0.1 0.0 - 0.7 K/uL   Basophils Absolute 0.0 0.0 - 0.1 K/uL   WBC Morphology ATYPICAL LYMPHOCYTES     Comment: WHITE COUNT CONFIRMED ON SMEAR   Smear Review PLATELET COUNT CONFIRMED BY SMEAR   Comprehensive metabolic panel     Status: Abnormal   Collection Time:  06/19/17  1:47 AM  Result Value Ref Range   Sodium 138 135 - 145 mmol/L   Potassium 3.4 (L) 3.5 - 5.1 mmol/L   Chloride 104 101 - 111 mmol/L   CO2 25 22 - 32 mmol/L   Glucose, Bld 128 (H) 65 - 99 mg/dL   BUN 15 6 - 20 mg/dL   Creatinine, Ser 1.07 0.61 - 1.24 mg/dL   Calcium 8.9 8.9 - 10.3 mg/dL   Total Protein 7.0 6.5 - 8.1 g/dL   Albumin 3.6 3.5 - 5.0 g/dL   AST 32 15 - 41 U/L   ALT 43 17 - 63 U/L   Alkaline Phosphatase 79 38 - 126 U/L   Total Bilirubin 0.5 0.3 - 1.2 mg/dL   GFR calc non Af Amer >60 >60 mL/min   GFR calc Af Amer >60 >60 mL/min    Comment: (NOTE) The eGFR has been calculated using the CKD EPI equation. This calculation has not been validated in all clinical situations. eGFR's persistently <60 mL/min signify possible Chronic Kidney Disease.    Anion gap 9 5 - 15  Lipase, blood     Status: None   Collection Time: 06/19/17  1:47 AM  Result Value Ref Range   Lipase 42 11 - 51 U/L  Urinalysis, Routine w reflex microscopic     Status: None   Collection Time: 06/19/17  4:20 AM  Result Value Ref Range   Color, Urine YELLOW YELLOW   APPearance CLEAR CLEAR   Specific Gravity, Urine 1.020 1.005 - 1.030   pH 6.5 5.0 - 8.0   Glucose, UA NEGATIVE NEGATIVE mg/dL   Hgb urine dipstick NEGATIVE NEGATIVE   Bilirubin Urine NEGATIVE NEGATIVE   Ketones, ur NEGATIVE NEGATIVE mg/dL   Protein, ur NEGATIVE NEGATIVE mg/dL   Nitrite NEGATIVE NEGATIVE   Leukocytes, UA NEGATIVE NEGATIVE    Comment: Microscopic not done on urines with negative protein, blood, leukocytes, nitrite, or glucose < 500 mg/dL.  Hepatic function panel     Status: Abnormal   Collection Time: 06/19/17  5:45 AM  Result Value Ref Range   Total Protein 6.7 6.5 - 8.1 g/dL   Albumin 3.2 (L) 3.5 - 5.0 g/dL   AST 30 15 - 41 U/L   ALT 40 17 - 63 U/L   Alkaline Phosphatase 66 38 - 126 U/L   Total Bilirubin 0.7 0.3 - 1.2 mg/dL   Bilirubin, Direct 0.1 0.1 - 0.5 mg/dL   Indirect Bilirubin 0.6 0.3 - 0.9 mg/dL   Ct  Renal Stone Study  Result Date: 06/19/2017 CLINICAL DATA:  Right upper quadrant and right  lower quadrant pain with onset around 6:08 p.m. Nausea and diarrhea. EXAM: CT ABDOMEN AND PELVIS WITHOUT CONTRAST TECHNIQUE: Multidetector CT imaging of the abdomen and pelvis was performed following the standard protocol without IV contrast. COMPARISON:  None. FINDINGS: Lower chest: Lung bases are clear. Hepatobiliary: Cholelithiasis with multiple stones in the gallbladder. Mild gallbladder wall thickening and edema may indicate cholecystitis. No focal liver lesions. No bile duct dilatation. Pancreas: Unremarkable. No pancreatic ductal dilatation or surrounding inflammatory changes. Spleen: Normal in size without focal abnormality. Adrenals/Urinary Tract: Adrenal glands are unremarkable. Kidneys are normal, without renal calculi, focal lesion, or hydronephrosis. Bladder is unremarkable. Stomach/Bowel: Stomach, small bowel, and colon are not abnormally distended. No inflammatory changes are demonstrated. Appendix is not visualized. Vascular/Lymphatic: No significant vascular findings are present. No enlarged abdominal or pelvic lymph nodes. Reproductive: Prostate is unremarkable. Other: No abdominal wall hernia or abnormality. No abdominopelvic ascites. Musculoskeletal: Mild degenerative changes in the spine. No destructive bone lesions. IMPRESSION: 1. Cholelithiasis with gallbladder wall thickening and edema suggesting acute cholecystitis. 2. No evidence of bowel obstruction or inflammation. Electronically Signed   By: Lucienne Capers M.D.   On: 06/19/2017 02:06   US Abdomen Limited Ruq  Result Date: 06/19/2017 CLINICAL DATA:  Acute right upper quadrant abdominal pain. EXAM: ULTRASOUND ABDOMEN LIMITED RIGHT UPPER QUADRANT COMPARISON:  CT scan of same day. FINDINGS: Gallbladder: Cholelithiasis is noted with significant gallbladder wall thickening. Positive sonographic Murphy's sign is noted. No pericholecystic fluid is  noted. Common bile duct: Diameter: 3 mm which is within normal limits. Liver: No focal lesion identified. Within normal limits in parenchymal echogenicity. Portal vein is patent on color Doppler imaging with normal direction of blood flow towards the liver. IMPRESSION: Cholelithiasis is noted with significant gallbladder wall thickening and positive sonographic Murphy's sign. This is concerning for cholecystitis. HIDA scan may be performed for further evaluation. Electronically Signed   By: Marijo Conception, M.D.   On: 06/19/2017 07:12    Review of Systems  Constitutional: Negative.        250 lbs range up until about 3 years ago and then over 300 lbs  HENT: Negative.   Eyes: Negative.   Respiratory: Negative.        Snores badly ? Sleep apnea per girlfriend.  Cardiovascular: Positive for leg swelling.  Gastrointestinal: Positive for abdominal pain (RUQ), diarrhea (chronic after meals for 3 years per girlfriend), heartburn and nausea. Negative for blood in stool, constipation, melena and vomiting.  Genitourinary:       Orange yellow with odor  Musculoskeletal: Negative.   Skin: Negative.   Neurological: Negative.   Endo/Heme/Allergies: Negative.   Psychiatric/Behavioral: Negative.     Blood pressure 116/62, pulse 77, temperature 98.7 F (37.1 C), temperature source Oral, resp. rate 18, SpO2 100 %. Physical Exam  Constitutional: He is oriented to person, place, and time. He appears well-developed and well-nourished. No distress.  HENT:  Head: Normocephalic and atraumatic.  Mouth/Throat: Oropharynx is clear and moist. No oropharyngeal exudate.  Eyes: Right eye exhibits no discharge. Left eye exhibits no discharge. No scleral icterus.  Pupils are equal  Neck: Normal range of motion. Neck supple. No JVD present. No tracheal deviation present. No thyromegaly present.  Cardiovascular: Normal rate, regular rhythm, normal heart sounds and intact distal pulses.  No murmur heard. Respiratory:  Effort normal and breath sounds normal. No respiratory distress. He has no wheezes. He has no rales. He exhibits no tenderness.  GI: Soft. Bowel sounds are normal. He exhibits  no distension and no mass. There is tenderness (RUQ). There is no rebound and no guarding.  Musculoskeletal: He exhibits no edema or tenderness.  Lymphadenopathy:    He has no cervical adenopathy.  Neurological: He is alert and oriented to person, place, and time. No cranial nerve deficit.  Skin: Skin is warm and dry. No rash noted. He is not diaphoretic. No erythema. No pallor.  Psychiatric: He has a normal mood and affect. His behavior is normal. Judgment and thought content normal.     Assessment/Plan Acute cholecystitis Long term diarrhea after PO intake.  Morbid obesity Possible sleep apnea ADHD Gout Hypertension - started on Meds last week- but not listed  Plan:   Admit and set up for laparoscopic cholecystectomy.  Satine Hausner, PA-C 06/19/2017, 7:56 AM

## 2017-06-19 NOTE — ED Notes (Signed)
Surgical PA at the bedside.  

## 2017-06-19 NOTE — ED Notes (Signed)
Report given to [facility] RN at [facility]

## 2017-06-19 NOTE — ED Notes (Addendum)
Pt states he last ate [employer] at 2230. Instructed to remain NPO. Voiced understanding.

## 2017-06-19 NOTE — Anesthesia Postprocedure Evaluation (Signed)
Anesthesia Post Note  Patient: Bernard MeekChristopher Lewis  Procedure(s) Performed: LAPAROSCOPIC CHOLECYSTECTOMY (N/A Abdomen)     Patient location during evaluation: PACU Anesthesia Type: General Level of consciousness: sedated Pain management: pain level controlled Vital Signs Assessment: post-procedure vital signs reviewed and stable Respiratory status: spontaneous breathing and respiratory function stable Cardiovascular status: stable Postop Assessment: no apparent nausea or vomiting Anesthetic complications: no    Last Vitals:  Vitals:   06/19/17 1510 06/19/17 1525  BP: (!) 144/92 (!) 148/87  Pulse: (!) 51 (!) 55  Resp: 13 12  Temp: 36.4 C 36.5 C  SpO2: 100% 99%    Last Pain:  Vitals:   06/19/17 1510  TempSrc:   PainSc: 0-No pain                 Stachia Slutsky DANIEL

## 2017-06-19 NOTE — ED Provider Notes (Signed)
Patient transferred [facility].  Patient presented with abdominal pain, nausea and vomiting.  CT scan did show gallstone with gallbladder wall thickening and pericholecystic fluid.  General surgery requested ultrasound because the patient's LFTs were not abnormal.  Patient has had his ultrasound and this does confirm acute cholecystitis.  Will reconsult general surgery for further care.   Gilda CreasePollina, Julie J, MD 06/19/17 [id number]

## 2017-06-19 NOTE — ED Notes (Signed)
Pt describes RUQ/RLQ pain onset around 6-8p. +nausea and diarrhea. Pacing. Hx ?kidney stone. "They gave me a strainer."

## 2017-06-19 NOTE — Transfer of Care (Signed)
Immediate Anesthesia Transfer of Care Note  Patient: Bernard Lewis  Procedure(s) Performed: LAPAROSCOPIC CHOLECYSTECTOMY (N/A Abdomen)  Patient Location: PACU  Anesthesia Type:General  Level of Consciousness: sedated  Airway & Oxygen Therapy: Patient Spontanous Breathing and Patient connected to face mask  Post-op Assessment: Report given to RN and Post -op Vital signs reviewed and stable  Post vital signs: Reviewed and stable  Last Vitals:  Vitals:   06/19/17 0805 06/19/17 1025  BP: 118/65 (!) 114/55  Pulse: (!) 53 (!) 56  Resp:  16  Temp:  (!) 36.4 C  SpO2: 99% 96%    Last Pain:  Vitals:   06/19/17 1025  TempSrc: Oral  PainSc:          Complications: No apparent anesthesia complications

## 2017-06-19 NOTE — ED Notes (Signed)
ED TO INPATIENT HANDOFF REPORT  Name/Age/Gender Bernard Lewis 37 y.o. male  Code Status    Code Status Orders  (From admission, onward)        Start     Ordered   06/19/17 0842  Full code  Continuous     06/19/17 0847    Code Status History    Date Active Date Inactive Code Status Order ID Comments User Context   This patient has a current code status but no historical code status.      Home/SNF/Other Home  Chief Complaint abdominal pain  Level of Care/Admitting Diagnosis ED Disposition    ED Disposition Condition Comment   Admit  Hospital Area: [facility] [[id number]]  Level of Care: Med-Surg [16]  Diagnosis: Cholelithiasis and acute cholecystitis without obstruction [536644]  Admitting Physician: CCS, [place]  Attending Physician: CCS, MD [[id number]  PT Class (Do Not Modify): Observation [104]  PT Acc Code (Do Not Modify): Observation [10022]       Medical History Past Medical History:  Diagnosis Date  . ADHD (attention deficit hyperactivity disorder)   . Anxiety   . Gout   . Hypertension     Allergies No Known Allergies  IV Location/Drains/Wounds Patient Lines/Drains/Airways Status   Active Line/Drains/Airways    Name:   Placement date:   Placement time:   Site:   Days:   Peripheral IV 06/19/17 Right Forearm   06/19/17    0135    Forearm   less than 1          Labs/Imaging Results for orders placed or performed during the hospital encounter of 06/19/17 (from the past 48 hour(s))  CBC with Differential     Status: Abnormal   Collection Time: 06/19/17  1:47 AM  Result Value Ref Range   WBC 7.1 4.0 - 10.5 K/uL   RBC 4.90 4.22 - 5.81 MIL/uL   Hemoglobin 14.7 13.0 - 17.0 g/dL   HCT 43.6 39.0 - 52.0 %   MCV 89.0 78.0 - 100.0 fL   MCH 30.0 26.0 - 34.0 pg   MCHC 33.7 30.0 - 36.0 g/dL   RDW 12.7 11.5 - 15.5 %   Platelets 314 150 - 400 K/uL   Neutrophils Relative % 50 %   Lymphocytes Relative 33 %   Monocytes Relative 15  %   Eosinophils Relative 2 %   Basophils Relative 0 %   Neutro Abs 3.6 1.7 - 7.7 K/uL   Lymphs Abs 2.3 0.7 - 4.0 K/uL   Monocytes Absolute 1.1 (H) 0.1 - 1.0 K/uL   Eosinophils Absolute 0.1 0.0 - 0.7 K/uL   Basophils Absolute 0.0 0.0 - 0.1 K/uL   WBC Morphology ATYPICAL LYMPHOCYTES     Comment: WHITE COUNT CONFIRMED ON SMEAR   Smear Review PLATELET COUNT CONFIRMED BY SMEAR   Comprehensive metabolic panel     Status: Abnormal   Collection Time: 06/19/17  1:47 AM  Result Value Ref Range   Sodium 138 135 - 145 mmol/L   Potassium 3.4 (L) 3.5 - 5.1 mmol/L   Chloride 104 101 - 111 mmol/L   CO2 25 22 - 32 mmol/L   Glucose, Bld 128 (H) 65 - 99 mg/dL   BUN 15 6 - 20 mg/dL   Creatinine, Ser 1.07 0.61 - 1.24 mg/dL   Calcium 8.9 8.9 - 10.3 mg/dL   Total Protein 7.0 6.5 - 8.1 g/dL   Albumin 3.6 3.5 - 5.0 g/dL   AST 32 15 -  41 U/L   ALT 43 17 - 63 U/L   Alkaline Phosphatase 79 38 - 126 U/L   Total Bilirubin 0.5 0.3 - 1.2 mg/dL   GFR calc non Af Amer >60 >60 mL/min   GFR calc Af Amer >60 >60 mL/min    Comment: (NOTE) The eGFR has been calculated using the CKD EPI equation. This calculation has not been validated in all clinical situations. eGFR's persistently <60 mL/min signify possible Chronic Kidney Disease.    Anion gap 9 5 - 15  Lipase, blood     Status: None   Collection Time: 06/19/17  1:47 AM  Result Value Ref Range   Lipase 42 11 - 51 U/L  Urinalysis, Routine w reflex microscopic     Status: None   Collection Time: 06/19/17  4:20 AM  Result Value Ref Range   Color, Urine YELLOW YELLOW   APPearance CLEAR CLEAR   Specific Gravity, Urine 1.020 1.005 - 1.030   pH 6.5 5.0 - 8.0   Glucose, UA NEGATIVE NEGATIVE mg/dL   Hgb urine dipstick NEGATIVE NEGATIVE   Bilirubin Urine NEGATIVE NEGATIVE   Ketones, ur NEGATIVE NEGATIVE mg/dL   Protein, ur NEGATIVE NEGATIVE mg/dL   Nitrite NEGATIVE NEGATIVE   Leukocytes, UA NEGATIVE NEGATIVE    Comment: Microscopic not done on urines with  negative protein, blood, leukocytes, nitrite, or glucose < 500 mg/dL.  Hepatic function panel     Status: Abnormal   Collection Time: 06/19/17  5:45 AM  Result Value Ref Range   Total Protein 6.7 6.5 - 8.1 g/dL   Albumin 3.2 (L) 3.5 - 5.0 g/dL   AST 30 15 - 41 U/L   ALT 40 17 - 63 U/L   Alkaline Phosphatase 66 38 - 126 U/L   Total Bilirubin 0.7 0.3 - 1.2 mg/dL   Bilirubin, Direct 0.1 0.1 - 0.5 mg/dL   Indirect Bilirubin 0.6 0.3 - 0.9 mg/dL   Ct Renal Stone Study  Result Date: 06/19/2017 CLINICAL DATA:  Right upper quadrant and right lower quadrant pain with onset around 6:08 p.m. Nausea and diarrhea. EXAM: CT ABDOMEN AND PELVIS WITHOUT CONTRAST TECHNIQUE: Multidetector CT imaging of the abdomen and pelvis was performed following the standard protocol without IV contrast. COMPARISON:  None. FINDINGS: Lower chest: Lung bases are clear. Hepatobiliary: Cholelithiasis with multiple stones in the gallbladder. Mild gallbladder wall thickening and edema may indicate cholecystitis. No focal liver lesions. No bile duct dilatation. Pancreas: Unremarkable. No pancreatic ductal dilatation or surrounding inflammatory changes. Spleen: Normal in size without focal abnormality. Adrenals/Urinary Tract: Adrenal glands are unremarkable. Kidneys are normal, without renal calculi, focal lesion, or hydronephrosis. Bladder is unremarkable. Stomach/Bowel: Stomach, small bowel, and colon are not abnormally distended. No inflammatory changes are demonstrated. Appendix is not visualized. Vascular/Lymphatic: No significant vascular findings are present. No enlarged abdominal or pelvic lymph nodes. Reproductive: Prostate is unremarkable. Other: No abdominal wall hernia or abnormality. No abdominopelvic ascites. Musculoskeletal: Mild degenerative changes in the spine. No destructive bone lesions. IMPRESSION: 1. Cholelithiasis with gallbladder wall thickening and edema suggesting acute cholecystitis. 2. No evidence of bowel  obstruction or inflammation. Electronically Signed   By: Lucienne Capers M.D.   On: 06/19/2017 02:06   US Abdomen Limited Ruq  Result Date: 06/19/2017 CLINICAL DATA:  Acute right upper quadrant abdominal pain. EXAM: ULTRASOUND ABDOMEN LIMITED RIGHT UPPER QUADRANT COMPARISON:  CT scan of same day. FINDINGS: Gallbladder: Cholelithiasis is noted with significant gallbladder wall thickening. Positive sonographic Murphy's sign is noted. No pericholecystic  fluid is noted. Common bile duct: Diameter: 3 mm which is within normal limits. Liver: No focal lesion identified. Within normal limits in parenchymal echogenicity. Portal vein is patent on color Doppler imaging with normal direction of blood flow towards the liver. IMPRESSION: Cholelithiasis is noted with significant gallbladder wall thickening and positive sonographic Murphy's sign. This is concerning for cholecystitis. HIDA scan may be performed for further evaluation. Electronically Signed   By: Marijo Conception, M.D.   On: 06/19/2017 07:12    Pending Labs Unresulted Labs (From admission, onward)   Start     Ordered   06/19/17 0842  HIV antibody (Routine Testing)  Once,   R     06/19/17 0847      Vitals/Pain Today's Vitals   06/19/17 0626 06/19/17 0730 06/19/17 0801 06/19/17 0805  BP:  (!) 108/49  118/65  Pulse:  (!) 48  (!) 53  Resp:      Temp:      TempSrc:      SpO2:  91%  99%  PainSc: 6   7      Isolation Precautions No active isolations  Medications Medications  dextrose 5 % in lactated ringers infusion (not administered)  cefTRIAXone (ROCEPHIN) 2 g in dextrose 5 % 50 mL IVPB (not administered)  morphine 2 MG/ML injection 1-4 mg (not administered)  diphenhydrAMINE (BENADRYL) capsule 25 mg (not administered)    Or  diphenhydrAMINE (BENADRYL) injection 25 mg (not administered)  ondansetron (ZOFRAN-ODT) disintegrating tablet 4 mg (not administered)    Or  ondansetron (ZOFRAN) injection 4 mg (not administered)  sodium  chloride 0.9 % bolus 1,000 mL (0 mLs Intravenous Stopped 06/19/17 0410)  morphine 4 MG/ML injection 4 mg (4 mg Intravenous Given 06/19/17 0149)  ondansetron (ZOFRAN) injection 4 mg (4 mg Intravenous Given 06/19/17 0146)  morphine 4 MG/ML injection 4 mg (4 mg Intravenous Given 06/19/17 0318)  cefTRIAXone (ROCEPHIN) 1 g in dextrose 5 % 50 mL IVPB (0 g Intravenous Stopped 06/19/17 0410)  HYDROmorphone (DILAUDID) injection 1 mg (1 mg Intravenous Given 06/19/17 0425)  ondansetron (ZOFRAN) injection 4 mg (4 mg Intravenous Given 06/19/17 0626)  HYDROmorphone (DILAUDID) injection 1 mg (1 mg Intravenous Given 06/19/17 0811)    Mobility walks

## 2017-06-19 NOTE — ED Notes (Signed)
[facility]. MD aware and agreeable to plan.

## 2017-06-19 NOTE — ED Notes (Signed)
Reminded pt to remain npo and left with family being [facility]

## 2017-06-20 ENCOUNTER — Encounter (HOSPITAL_COMMUNITY): Payer: Self-pay | Admitting: General Surgery

## 2017-06-20 LAB — HIV ANTIBODY (ROUTINE TESTING W REFLEX): HIV Screen 4th Generation wRfx: NONREACTIVE

## 2017-06-20 MED ORDER — NAPROXEN 500 MG PO TABS: 500.0000 mg | ORAL_TABLET | Freq: Two times a day (BID) | ORAL | Status: DC | PRN

## 2017-06-20 MED ORDER — IBUPROFEN 200 MG PO TABS: ORAL_TABLET | ORAL | 0 refills | Status: AC

## 2017-06-20 MED ORDER — OXYCODONE HCL 5 MG PO TABS: 5.0000 mg | ORAL_TABLET | Freq: Four times a day (QID) | ORAL | 0 refills | Status: DC | PRN

## 2017-06-20 MED ORDER — NAPROXEN SODIUM 220 MG PO TABS: ORAL_TABLET | ORAL | Status: AC

## 2017-06-20 MED ORDER — ACETAMINOPHEN 500 MG PO TABS: ORAL_TABLET | ORAL | 0 refills | Status: AC

## 2017-06-20 NOTE — Progress Notes (Signed)
1 Day Post-Op   Subjective/Chief Complaint: Sore, tol diet, voiding   Objective: Vital signs in last 24 hours: Temp:  [97.4 F (36.3 C)-98.4 F (36.9 C)] 97.7 F (36.5 C) (01/29 0453) Pulse Rate:  [48-88] 74 (01/29 0453) Resp:  [12-20] 18 (01/29 0453) BP: (108-148)/(49-98) 133/60 (01/29 0453) SpO2:  [91 %-100 %] 95 % (01/29 0453) Weight:  [146.5 kg (323 lb)] 146.5 kg (323 lb) (01/28 1006) Last BM Date: 06/18/17  Intake/Output from previous day: 01/28 0701 - 01/29 0700 In: 2632.5 [P.O.:370; I.V.:2212.5; IV Piggyback:50] Out: 1035 [Urine:1025; Blood:10] Intake/Output this shift: Total I/O In: 1032.5 [P.O.:370; I.V.:612.5; IV Piggyback:50] Out: 625 [Urine:625]  General appearance: no distress Resp: clear to auscultation bilaterally Cardio: regular rate and rhythm GI: soft approp tender incision clean  Lab Results:  Recent Labs    06/19/17 0147  WBC 7.1  HGB 14.7  HCT 43.6  PLT 314   BMET Recent Labs    06/19/17 0147 06/19/17 1709  NA 138 139  K 3.4* 3.4*  CL 104 106  CO2 25 26  GLUCOSE 128* 108*  BUN 15 11  CREATININE 1.07 0.91  CALCIUM 8.9 8.9   PT/INR No results for input(s): LABPROT, INR in the last 72 hours. ABG No results for input(s): PHART, HCO3 in the last 72 hours.  Invalid input(s): PCO2, PO2  Studies/Results: Ct Renal Stone Study  Result Date: 06/19/2017 CLINICAL DATA:  Right upper quadrant and right lower quadrant pain with onset around 6:08 p.m. Nausea and diarrhea. EXAM: CT ABDOMEN AND PELVIS WITHOUT CONTRAST TECHNIQUE: Multidetector CT imaging of the abdomen and pelvis was performed following the standard protocol without IV contrast. COMPARISON:  None. FINDINGS: Lower chest: Lung bases are clear. Hepatobiliary: Cholelithiasis with multiple stones in the gallbladder. Mild gallbladder wall thickening and edema may indicate cholecystitis. No focal liver lesions. No bile duct dilatation. Pancreas: Unremarkable. No pancreatic ductal dilatation  or surrounding inflammatory changes. Spleen: Normal in size without focal abnormality. Adrenals/Urinary Tract: Adrenal glands are unremarkable. Kidneys are normal, without renal calculi, focal lesion, or hydronephrosis. Bladder is unremarkable. Stomach/Bowel: Stomach, small bowel, and colon are not abnormally distended. No inflammatory changes are demonstrated. Appendix is not visualized. Vascular/Lymphatic: No significant vascular findings are present. No enlarged abdominal or pelvic lymph nodes. Reproductive: Prostate is unremarkable. Other: No abdominal wall hernia or abnormality. No abdominopelvic ascites. Musculoskeletal: Mild degenerative changes in the spine. No destructive bone lesions. IMPRESSION: 1. Cholelithiasis with gallbladder wall thickening and edema suggesting acute cholecystitis. 2. No evidence of bowel obstruction or inflammation. Electronically Signed   By: Burman Nieves M.D.   On: 06/19/2017 02:06   US Abdomen Limited Ruq  Result Date: 06/19/2017 CLINICAL DATA:  Acute right upper quadrant abdominal pain. EXAM: ULTRASOUND ABDOMEN LIMITED RIGHT UPPER QUADRANT COMPARISON:  CT scan of same day. FINDINGS: Gallbladder: Cholelithiasis is noted with significant gallbladder wall thickening. Positive sonographic Murphy's sign is noted. No pericholecystic fluid is noted. Common bile duct: Diameter: 3 mm which is within normal limits. Liver: No focal lesion identified. Within normal limits in parenchymal echogenicity. Portal vein is patent on color Doppler imaging with normal direction of blood flow towards the liver. IMPRESSION: Cholelithiasis is noted with significant gallbladder wall thickening and positive sonographic Murphy's sign. This is concerning for cholecystitis. HIDA scan may be performed for further evaluation. Electronically Signed   By: Lupita Raider, M.D.   On: 06/19/2017 07:12    Anti-infectives: Anti-infectives (From admission, onward)   Start     Dose/Rate  Route Frequency  Ordered Stop   06/19/17 1600  cefTRIAXone (ROCEPHIN) 2 g in dextrose 5 % 50 mL IVPB    Comments:  Pt has had a dose of this today per [facility]   2 g 100 mL/hr over 30 Minutes Intravenous Every 24 hours 06/19/17 0847     06/19/17 1145  ceFAZolin (ANCEF) 3 g in dextrose 5 % 50 mL IVPB     3 g 130 mL/hr over 30 Minutes Intravenous  Once 06/19/17 1133 06/19/17 1236   06/19/17 1128  ceFAZolin (ANCEF) 2-4 GM/100ML-% IVPB  Status:  Discontinued    Comments:  Curlene DolphinWhitlow, Cheryl   : cabinet override      06/19/17 1128 06/19/17 1138   06/19/17 0315  cefTRIAXone (ROCEPHIN) 1 g in dextrose 5 % 50 mL IVPB     1 g 100 mL/hr over 30 Minutes Intravenous  Once 06/19/17 0302 06/19/17 0410      Assessment/Plan: POD lap chole for acute cholecystitis  Plan dc home today  Bernard Lewis 06/20/2017

## 2017-06-20 NOTE — Progress Notes (Signed)
Discharge instructions reviewed with patient and all questions answered. Patient wheeled down to vehicle with belongings

## 2017-06-22 NOTE — Discharge Summary (Signed)
Physician Discharge Summary  Patient ID: Bernard Lewis MRN: [id number] DOB/AGE: 1981/02/18 37 y.o.  Admit date: 06/19/2017 Discharge date: 06/20/2017  Admission Diagnoses:  Acute cholecystitis Long-term diarrhea after p.o. Intake Body mass index is 41.47  Possible sleep apnea -per significant other ADHD Gout Hypertension -started on medications last week  Discharge Diagnoses:  Acute cholecystitis Long-term diarrhea after p.o. Intake Body mass index is 41.47  Possible sleep apnea -per anesthesia ADHD Gout Hypertension -started on medications last week   Active Problems:   Cholelithiasis and acute cholecystitis without obstruction   PROCEDURES: Laparoscopic cholecystectomy, 06/19/17 Dr. Charm RingsMatthew Wakefield  Hospital Course: 37 year old male who presented to the [facility] acute onset of right hip pain last evening.  Symptoms started around 8 PM.  He rated his pain is 10/10 he has had similar pains in the past renal stones.  Workup in the ED there shows he is afebrile vital signs are stable.  Labs shows essentially normal CMP lipase is 42, LFTs are normal.  WBC is 7.1, hemoglobin 14.7, hematocrit 43.6, platelets 314,000.  Urinalysis is clear, negative nitrates.  CT scan showed cholelithiasis with gallbladder wall thickening and edema suggesting acute cholecystitis no evidence of bowel obstruction or inflammation.  Gallbladder ultrasound shows cholelithiasis with significant gallbladder wall thickening.  No pericholecystic fluid, common bile duct was 3 mm and normal.  [facility] evaluation and treatment. No family hx of irritable bowel disease.    He was seen in the emergency department and admitted.  He was taken the operating room later that day.  He had significant cholecystitis.  It was noted by anesthesia that he had significant sleep apnea and it took very little to put him to sleep.  After surgery he was extubated and had  no real postoperative respiratory issues.  His diet was advanced he was mobilized and ready for discharge the following a.m.  Because of his observed sleep apnea we have obtained a follow-up appointment with pulmonary medicine.  He will follow-up with our office in 2 weeks.  Condition on discharge: Improved.   CBC Latest Ref Rng & Units 06/19/2017 11/11/2015 12/25/2014  WBC 4.0 - 10.5 K/uL 7.1 9.7 10.7(H)  Hemoglobin 13.0 - 17.0 g/dL 40.914.7 81.115.6 91.414.5  Hematocrit 39.0 - 52.0 % 43.6 42.9 43.1  Platelets 150 - 400 K/uL 314 277 305   . CMP Latest Ref Rng & Units 06/19/2017 06/19/2017 06/19/2017  Glucose 65 - 99 mg/dL 782(N108(H) - 562(Z128(H)  BUN 6 - 20 mg/dL 11 - 15  Creatinine 3.080.61 - 1.24 mg/dL 6.570.91 - 8.461.07  Sodium 962135 - 145 mmol/L 139 - 138  Potassium 3.5 - 5.1 mmol/L 3.4(L) - 3.4(L)  Chloride 101 - 111 mmol/L 106 - 104  CO2 22 - 32 mmol/L 26 - 25  Calcium 8.9 - 10.3 mg/dL 8.9 - 8.9  Total Protein 6.5 - 8.1 g/dL 7.0 6.7 7.0  Total Bilirubin 0.3 - 1.2 mg/dL 0.4 0.7 0.5  Alkaline Phos 38 - 126 U/L 77 66 79  AST 15 - 41 U/L 39 30 32  ALT 17 - 63 U/L 48 40 43    Disposition: 01-Home or Self Care   Allergies as of 06/20/2017   No Known Allergies     Medication List    TAKE these medications   acetaminophen 500 MG tablet Commonly known as:  TYLENOL You can take 2 tablets every 6 hours.   DO NOT TAKE MORE THAN 4000 MG OF  TYLENOL PER DAY.  IT CAN HARM YOUR LIVER. . What changed:    how much to take  how to take this  when to take this  reasons to take this  additional instructions   allopurinol 300 MG tablet Commonly known as:  ZYLOPRIM Take 300 mg by mouth daily.   amphetamine-dextroamphetamine 30 MG tablet Commonly known as:  ADDERALL Take 30 mg by mouth 3 (three) times daily.   ibuprofen 200 MG tablet Commonly known as:  ADVIL,MOTRIN You can take 2-3 tablets every 6 hours as needed for pain.  You can alternate this with plain Tylenol, or the oxycodone. What changed:    how  much to take  how to take this  when to take this  reasons to take this  additional instructions   lisinopril-hydrochlorothiazide 10-12.5 MG tablet Commonly known as:  PRINZIDE,ZESTORETIC Take 1 tablet by mouth daily.   MITIGARE 0.6 MG Caps Generic drug:  Colchicine Take 0.6 mg by mouth daily as needed (gout flare up.).   naproxen sodium 220 MG tablet Commonly known as:  ALEVE You can take 2 tablets every 12 hours as listed on the directions.  Do not take both the Aleve and the ibuprofen, both are NSAID drugs and can harm your kidneys.  You can buy these drugs over-the-counter at any drugstore. What changed:    how much to take  how to take this  when to take this  reasons to take this  additional instructions   oxyCODONE 5 MG immediate release tablet Commonly known as:  Oxy IR/ROXICODONE Take 1 tablet (5 mg total) by mouth every 6 (six) hours as needed for moderate pain.      Follow-up Information    [facility] Follow up on 07/04/2017.   Specialty:  General Surgery Why:  Your appointment is at 9:15 AM.  Please arrive 30 min prior to appointment time. Bring photo ID and insurance information.  Contact information: [address] [place] [address] [phone]        Coralyn Helling, MD Follow up on 07/19/2017.   Specialty:  Pulmonary Disease Why:  at 915 am  Contact information: [address] [place] [address] [phone]           Signed: Sherrie George 06/22/2017, 2:07 PM

## 2017-07-19 ENCOUNTER — Ambulatory Visit (INDEPENDENT_AMBULATORY_CARE_PROVIDER_SITE_OTHER): Payer: BLUE CROSS/BLUE SHIELD | Admitting: Pulmonary Disease

## 2017-07-19 ENCOUNTER — Encounter: Payer: Self-pay | Admitting: Pulmonary Disease

## 2017-07-19 VITALS — BP 118/82 | HR 80 | Ht 75.0 in | Wt 318.8 lb

## 2017-07-19 DIAGNOSIS — J351 Hypertrophy of tonsils: Secondary | ICD-10-CM

## 2017-07-19 DIAGNOSIS — R29818 Other symptoms and signs involving the nervous system: Secondary | ICD-10-CM

## 2017-07-19 NOTE — Progress Notes (Signed)
   Subjective:    Patient ID: Bernard Lewis, male    DOB: 1980/08/08, 37 y.o.   MRN: [id number]  HPI    Review of Systems  Constitutional: Positive for unexpected weight change. Negative for fever.  HENT: Negative for congestion, dental problem, ear pain, nosebleeds, postnasal drip, rhinorrhea, sinus pressure, sneezing, sore throat and trouble swallowing.   Eyes: Negative for redness and itching.  Respiratory: Negative for cough, chest tightness, shortness of breath and wheezing.   Cardiovascular: Negative for palpitations and leg swelling.  Gastrointestinal: Positive for abdominal pain. Negative for nausea and vomiting.  Genitourinary: Negative for dysuria.  Musculoskeletal: Negative for joint swelling.  Skin: Negative for rash.  Neurological: Negative for headaches.  Hematological: Does not bruise/bleed easily.  Psychiatric/Behavioral: Negative for dysphoric mood. The patient is not nervous/anxious.        Objective:   Physical Exam        Assessment & Plan:

## 2017-07-19 NOTE — Patient Instructions (Signed)
Will arrange for home sleep study Will call to arrange for follow up after sleep study reviewed  

## 2017-07-19 NOTE — Progress Notes (Signed)
[facility]  Chief Complaint  Patient presents with  . Sleep Consult    Dr. Ashley Royalty referred pt for Sleep Apnea, partner states he stops breathing in his sleep, no energy when he wakes up    Vital signs: BP 118/82 (BP Location: Left Arm, Cuff Size: Large)   Pulse 80   Ht 6\' 3"  (1.905 m)   Wt (!) 318 lb 12.8 oz (144.6 kg)   SpO2 95%   BMI 39.85 kg/m   History of Present Illness: Bernard Lewis is a 37 y.o. male for evaluation of sleep problems.  He is here with his wife.  He had gallbladder surgery recently.  He was told by the doctors that he stops breathing while asleep, his oxygen dropped and he needed to get checked for sleep apnea.  His wife says he is a very loud snorer, and will stop breathing at night.  He will wake up at times feeling choked.  He can't sleep on his back.  He will talk in his sleep at times.  He goes to sleep between 11 pm and 2 am.  He will often times just lay in bed until he falls asleep.  He takes adderall 3 times per day.  He wakes up between 11 am and 1 pm when he is not working.  He takes adderall when he wakes up.  Then tries to squeeze another two doses before 5 pm.  He doesn't take adderall after 5 pm.  When he works, he wakes up at 7 am.  He works as a Estate agent.  He will wake up once during the night to use the bathroom.  He doesn't use anything to help him sleep.  He denies sleep walking, bruxism, or nightmares.  There is no history of restless legs.  He denies sleep hallucinations, sleep paralysis, or cataplexy.  The Epworth score is 15 out of 24.    Physical Exam:  General - pleasant Eyes - pupils reactive ENT - no sinus tenderness, no oral exudate, no LAN, MP 2, 3+ tonsils Cardiac - regular, no murmur Chest - no wheeze, rales Abd - soft, non tender Ext - no edema Skin - no rashes Neuro - normal strength Psych - normal mood  Discussion: He has snoring, sleep disruption, witnessed  apnea, and daytime sleepiness.  He has history of hypertension and ADHD.  He also has tonsillar hypertrophy.  His BMI is > 35.  I am concerned he could have sleep apnea.  We discussed how sleep apnea can affect various health problems, including risks for hypertension, cardiovascular disease, and diabetes.  We also discussed how sleep disruption can increase risks for accidents, such as while driving.  Weight loss as a means of improving sleep apnea was also reviewed.  Additional treatment options discussed were CPAP therapy, oral appliance, and surgical intervention.  Assessment/Plan:  Suspected sleep apnea. - will arrange for home sleep study  ADHD. - advised him to avoid taking adderall too late in the day - explained how many of the symptoms attributed to ADHD can overlap with symptoms from sleep disruption related to sleep apnea - will need to reassess his need for stimulant medication after review of his sleep study  Tonsillar hypertrophy. - if he is found to have sleep apnea, then he might need ENT assessment for intervention with this   Patient Instructions  Will arrange for home sleep study Will call to arrange for follow up after sleep study reviewed  Coralyn HellingVineet Wilver Tignor, MD [facility] 07/19/2017, 10:02 AM Pager:  [phone]  Flow Sheet  Sleep tests:  Review of Systems: Constitutional: Positive for unexpected weight change. Negative for fever.  HENT: Negative for congestion, dental problem, ear pain, nosebleeds, postnasal drip, rhinorrhea, sinus pressure, sneezing, sore throat and trouble swallowing.   Eyes: Negative for redness and itching.  Respiratory: Negative for cough, chest tightness, shortness of breath and wheezing.   Cardiovascular: Negative for palpitations and leg swelling.  Gastrointestinal: Positive for abdominal pain. Negative for nausea and vomiting.  Genitourinary: Negative for dysuria.  Musculoskeletal: Negative for joint swelling.    Skin: Negative for rash.  Neurological: Negative for headaches.  Hematological: Does not bruise/bleed easily.  Psychiatric/Behavioral: Negative for dysphoric mood. The patient is not nervous/anxious.    Past Medical History: He  has a past medical history of ADHD (attention deficit hyperactivity disorder), Anxiety, Gout, and Hypertension.  Past Surgical History: He  has a past surgical history that includes Cystectomy and Cholecystectomy (N/A, 06/19/2017).  Family History: His family history is not on file.  Social History: He  reports that he quit smoking about 5 years ago. he has never used smokeless tobacco. He reports that he does not drink alcohol or use drugs.  Medications: Allergies as of 07/19/2017   No Known Allergies     Medication List        Accurate as of 07/19/17 10:02 AM. Always use your most recent med list.          acetaminophen 500 MG tablet Commonly known as:  TYLENOL You can take 2 tablets every 6 hours.   DO NOT TAKE MORE THAN 4000 MG OF TYLENOL PER DAY.  IT CAN HARM YOUR LIVER. Marland Kitchen.   allopurinol 300 MG tablet Commonly known as:  ZYLOPRIM Take 300 mg by mouth daily.   amphetamine-dextroamphetamine 30 MG tablet Commonly known as:  ADDERALL Take 30 mg by mouth 3 (three) times daily.   ibuprofen 200 MG tablet Commonly known as:  ADVIL,MOTRIN You can take 2-3 tablets every 6 hours as needed for pain.  You can alternate this with plain Tylenol, or the oxycodone.   lisinopril-hydrochlorothiazide 10-12.5 MG tablet Commonly known as:  PRINZIDE,ZESTORETIC Take 1 tablet by mouth daily.   MITIGARE 0.6 MG Caps Generic drug:  Colchicine Take 0.6 mg by mouth daily as needed (gout flare up.).   naproxen sodium 220 MG tablet Commonly known as:  ALEVE You can take 2 tablets every 12 hours as listed on the directions.  Do not take both the Aleve and the ibuprofen, both are NSAID drugs and can harm your kidneys.  You can buy these drugs over-the-counter at any  drugstore.

## 2017-08-03 ENCOUNTER — Other Ambulatory Visit: Payer: Self-pay | Admitting: *Deleted

## 2017-08-03 DIAGNOSIS — G4733 Obstructive sleep apnea (adult) (pediatric): Secondary | ICD-10-CM | POA: Diagnosis not present

## 2017-08-03 DIAGNOSIS — R29818 Other symptoms and signs involving the nervous system: Secondary | ICD-10-CM

## 2017-08-07 ENCOUNTER — Encounter: Payer: Self-pay | Admitting: Pulmonary Disease

## 2017-08-07 ENCOUNTER — Telehealth: Payer: Self-pay | Admitting: Pulmonary Disease

## 2017-08-07 DIAGNOSIS — G4733 Obstructive sleep apnea (adult) (pediatric): Secondary | ICD-10-CM

## 2017-08-07 HISTORY — DX: Obstructive sleep apnea (adult) (pediatric): G47.33

## 2017-08-07 NOTE — Telephone Encounter (Signed)
HST 08/03/17 >> AHI 46.3, SaO2 low 70%   Will have my nurse inform pt that sleep study shows severe sleep apnea.  Options are 1) CPAP now, 2) ROV first.  If He is agreeable to CPAP, then please send order for auto CPAP range 5 to 15 cm H2O with heated humidity and mask of choice.  Have download sent 1 month after starting CPAP and set up ROV 2 months after starting CPAP.  ROV can be with me or NP.

## 2017-08-09 NOTE — Telephone Encounter (Signed)
Attempted to call pt but no answer and unable to leave VM due to VM box not being set up.  Will try to call back later.

## 2017-08-11 NOTE — Telephone Encounter (Signed)
Called and spoke with patient advised of VS response, he states that he would like to come in to be seen before starting on the machine. Appointment made nothing further needed.

## 2017-08-17 ENCOUNTER — Ambulatory Visit: Payer: Self-pay | Admitting: Adult Health

## 2017-08-17 ENCOUNTER — Telehealth: Payer: Self-pay | Admitting: Adult Health

## 2017-08-17 DIAGNOSIS — G4733 Obstructive sleep apnea (adult) (pediatric): Secondary | ICD-10-CM

## 2017-08-17 NOTE — Telephone Encounter (Signed)
Order has not been placed for cpap. Per phone note pt wanted to be seen before starting on machine.  He had appt scheduled for today but it has been canceled.  Notified Jess and she is going to call pt.  Message routed to her for follow up.

## 2017-08-21 NOTE — Telephone Encounter (Signed)
Called and spoke with patient. Patient stated that he would rather go ahead and have CPAP ordered and then have the follow up appointment. Scheduled follow up appointment with Dr. Craige CottaSood on 11/06/2017 and placed order for CPAP per Dr. Craige CottaSood.   Nothing further is needed at this time.

## 2017-11-06 ENCOUNTER — Ambulatory Visit: Payer: Self-pay | Admitting: Pulmonary Disease

## 2018-05-19 ENCOUNTER — Other Ambulatory Visit: Payer: Self-pay

## 2018-05-19 ENCOUNTER — Emergency Department (HOSPITAL_COMMUNITY): Payer: BLUE CROSS/BLUE SHIELD

## 2018-05-19 ENCOUNTER — Emergency Department (HOSPITAL_COMMUNITY)
Admission: EM | Admit: 2018-05-19 | Discharge: 2018-05-19 | Disposition: A | Discharge status: Home / Self Care | Payer: BLUE CROSS/BLUE SHIELD | Attending: Emergency Medicine | Admitting: Emergency Medicine

## 2018-05-19 DIAGNOSIS — S61209A Unspecified open wound of unspecified finger without damage to nail, initial encounter: Secondary | ICD-10-CM

## 2018-05-19 DIAGNOSIS — S92911A Unspecified fracture of right toe(s), initial encounter for closed fracture: Secondary | ICD-10-CM | POA: Insufficient documentation

## 2018-05-19 DIAGNOSIS — S63259A Unspecified dislocation of unspecified finger, initial encounter: Secondary | ICD-10-CM

## 2018-05-19 DIAGNOSIS — S61216A Laceration without foreign body of right little finger without damage to nail, initial encounter: Secondary | ICD-10-CM | POA: Insufficient documentation

## 2018-05-19 DIAGNOSIS — Z79899 Other long term (current) drug therapy: Secondary | ICD-10-CM | POA: Insufficient documentation

## 2018-05-19 DIAGNOSIS — Y939 Activity, unspecified: Secondary | ICD-10-CM | POA: Insufficient documentation

## 2018-05-19 DIAGNOSIS — Y998 Other external cause status: Secondary | ICD-10-CM | POA: Diagnosis not present

## 2018-05-19 DIAGNOSIS — S63286A Dislocation of proximal interphalangeal joint of right little finger, initial encounter: Secondary | ICD-10-CM | POA: Diagnosis not present

## 2018-05-19 DIAGNOSIS — Z23 Encounter for immunization: Secondary | ICD-10-CM | POA: Diagnosis not present

## 2018-05-19 DIAGNOSIS — I10 Essential (primary) hypertension: Secondary | ICD-10-CM | POA: Insufficient documentation

## 2018-05-19 DIAGNOSIS — Y92410 Unspecified street and highway as the place of occurrence of the external cause: Secondary | ICD-10-CM | POA: Insufficient documentation

## 2018-05-19 DIAGNOSIS — S6991XA Unspecified injury of right wrist, hand and finger(s), initial encounter: Secondary | ICD-10-CM | POA: Diagnosis present

## 2018-05-19 DIAGNOSIS — Z87891 Personal history of nicotine dependence: Secondary | ICD-10-CM | POA: Insufficient documentation

## 2018-05-19 MED ORDER — TETANUS-DIPHTH-ACELL PERTUSSIS 5-2.5-18.5 LF-MCG/0.5 IM SUSP
0.5000 mL | Freq: Once | INTRAMUSCULAR | Status: AC
  Administered 2018-05-19: 0.5 mL via INTRAMUSCULAR
  Filled 2018-05-19: qty 0.5

## 2018-05-19 MED ORDER — CEPHALEXIN 500 MG PO CAPS: 500.0000 mg | ORAL_CAPSULE | Freq: Two times a day (BID) | ORAL | 0 refills | Status: AC

## 2018-05-19 MED ORDER — OXYCODONE-ACETAMINOPHEN 5-325 MG PO TABS
2.0000 | ORAL_TABLET | Freq: Once | ORAL | Status: AC
  Administered 2018-05-19: 2 via ORAL
  Filled 2018-05-19: qty 2

## 2018-05-19 MED ORDER — CEPHALEXIN 250 MG PO CAPS
500.0000 mg | ORAL_CAPSULE | Freq: Once | ORAL | Status: AC
  Administered 2018-05-19: 500 mg via ORAL
  Filled 2018-05-19: qty 2

## 2018-05-19 MED ORDER — LIDOCAINE-EPINEPHRINE (PF) 2 %-1:200000 IJ SOLN
20.0000 mL | Freq: Once | INTRAMUSCULAR | Status: AC
  Administered 2018-05-19: 20 mL
  Filled 2018-05-19: qty 20

## 2018-05-19 NOTE — ED Notes (Signed)
Patient transported to X-ray 

## 2018-05-19 NOTE — ED Notes (Signed)
Patient verbalizes understanding of discharge instructions. Opportunity for questioning and answers were provided. Armband removed by staff, pt discharged from ED.  
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
## 2018-05-19 NOTE — Discharge Instructions (Addendum)
Call the orthopedist for follow up  Return to the emergency department for any new or concerning symptoms  Take the antibiotics as prescribed and return to the ER for signs of infection  Suture removal in 10 days

## 2018-05-20 ENCOUNTER — Emergency Department (HOSPITAL_BASED_OUTPATIENT_CLINIC_OR_DEPARTMENT_OTHER)
Admission: EM | Admit: 2018-05-20 | Discharge: 2018-05-20 | Disposition: A | Discharge status: Home / Self Care | Payer: BLUE CROSS/BLUE SHIELD | Attending: Emergency Medicine | Admitting: Emergency Medicine

## 2018-05-20 ENCOUNTER — Emergency Department (HOSPITAL_BASED_OUTPATIENT_CLINIC_OR_DEPARTMENT_OTHER): Payer: BLUE CROSS/BLUE SHIELD

## 2018-05-20 ENCOUNTER — Other Ambulatory Visit: Payer: Self-pay

## 2018-05-20 ENCOUNTER — Encounter (HOSPITAL_BASED_OUTPATIENT_CLINIC_OR_DEPARTMENT_OTHER): Payer: Self-pay | Admitting: Emergency Medicine

## 2018-05-20 DIAGNOSIS — Z87891 Personal history of nicotine dependence: Secondary | ICD-10-CM | POA: Diagnosis not present

## 2018-05-20 DIAGNOSIS — Y92410 Unspecified street and highway as the place of occurrence of the external cause: Secondary | ICD-10-CM | POA: Insufficient documentation

## 2018-05-20 DIAGNOSIS — M79672 Pain in left foot: Secondary | ICD-10-CM | POA: Insufficient documentation

## 2018-05-20 DIAGNOSIS — I10 Essential (primary) hypertension: Secondary | ICD-10-CM | POA: Diagnosis not present

## 2018-05-20 DIAGNOSIS — M79644 Pain in right finger(s): Secondary | ICD-10-CM | POA: Insufficient documentation

## 2018-05-20 DIAGNOSIS — Z79899 Other long term (current) drug therapy: Secondary | ICD-10-CM | POA: Insufficient documentation

## 2018-05-20 DIAGNOSIS — S6992XA Unspecified injury of left wrist, hand and finger(s), initial encounter: Secondary | ICD-10-CM | POA: Diagnosis present

## 2018-05-20 DIAGNOSIS — S60212A Contusion of left wrist, initial encounter: Secondary | ICD-10-CM | POA: Diagnosis not present

## 2018-05-20 DIAGNOSIS — Y999 Unspecified external cause status: Secondary | ICD-10-CM | POA: Diagnosis not present

## 2018-05-20 DIAGNOSIS — M25532 Pain in left wrist: Secondary | ICD-10-CM

## 2018-05-20 DIAGNOSIS — M79671 Pain in right foot: Secondary | ICD-10-CM | POA: Diagnosis not present

## 2018-05-20 DIAGNOSIS — S50311A Abrasion of right elbow, initial encounter: Secondary | ICD-10-CM | POA: Diagnosis not present

## 2018-05-20 DIAGNOSIS — Y9389 Activity, other specified: Secondary | ICD-10-CM | POA: Diagnosis not present

## 2018-05-20 MED ORDER — METHOCARBAMOL 500 MG PO TABS: 500.0000 mg | ORAL_TABLET | Freq: Every evening | ORAL | 0 refills | Status: AC | PRN

## 2018-05-20 MED ORDER — HYDROCODONE-ACETAMINOPHEN 5-325 MG PO TABS: 1.0000 | ORAL_TABLET | Freq: Four times a day (QID) | ORAL | 0 refills | Status: AC | PRN

## 2018-05-20 MED ORDER — OXYCODONE-ACETAMINOPHEN 5-325 MG PO TABS
1.0000 | ORAL_TABLET | Freq: Once | ORAL | Status: AC
  Administered 2018-05-20: 1 via ORAL
  Filled 2018-05-20: qty 1

## 2018-05-20 MED ORDER — NAPROXEN 500 MG PO TABS: 500.0000 mg | ORAL_TABLET | Freq: Two times a day (BID) | ORAL | 0 refills | Status: AC
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
## 2018-05-20 NOTE — ED Triage Notes (Signed)
Pt will [facility] be [facility] per family pt is more swollen and on more pain today.

## 2018-05-20 NOTE — ED Notes (Signed)
Patient transported to X-ray 

## 2018-05-20 NOTE — Discharge Instructions (Signed)
Take naproxen 2 times a day with meals.  Do not take other anti-inflammatories at the same time (Advil, Motrin, ibuprofen, Aleve). You may supplement with Tylenol if you need further pain control. Use norco as needed for severe or breakthrough pain. Have caution when taking this medicine with the muscle relaxer.  Use robaxin as needed for muscle stiffness or soreness.  Have caution, this may make you tired or groggy.  Do not drive or operate heavy machinery while taking this medicine. Use ice packs or heating pads if this helps control your pain. You will likely have continued muscle stiffness and soreness over the next couple days.   Follow-up with orthopedics for further evaluation and management. Return to the emergency room if you develop vision changes, vomiting, slurred speech, numbness, loss of bowel or bladder control, or any new or worsening symptoms.

## 2018-05-20 NOTE — ED Notes (Signed)
PMS intact before and after. Pt tolerated well. All questions answered. 
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
## 2020-02-27 ENCOUNTER — Other Ambulatory Visit: Payer: Self-pay

## 2020-02-27 DIAGNOSIS — Z20822 Contact with and (suspected) exposure to covid-19: Secondary | ICD-10-CM

## 2020-02-28 LAB — NOVEL CORONAVIRUS, NAA: SARS-CoV-2, NAA: DETECTED — AB

## 2020-02-28 LAB — SARS-COV-2, NAA 2 DAY TAT

## 2020-02-29 ENCOUNTER — Telehealth: Payer: Self-pay | Admitting: Nurse Practitioner

## 2020-02-29 NOTE — Telephone Encounter (Signed)
Called to discuss with Tamsen Meek about Covid symptoms and the use of casirivimab/imdevimab, a combination monoclonal antibody infusion for those with mild to moderate Covid symptoms and at a high risk of hospitalization.     Pt is qualified for this infusion at the [facility] infusion center due to co-morbid conditions (BMI >  25).   Unable to reach. Voicemail left and Mychart message sent.   Patient Active Problem List   Diagnosis Date Noted  . OSA (obstructive sleep apnea) 08/07/2017  . Cholelithiasis and acute cholecystitis without obstruction 06/19/2017  . 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

## 2020-03-01 ENCOUNTER — Telehealth: Payer: Self-pay | Admitting: Physician Assistant

## 2020-03-01 NOTE — Telephone Encounter (Signed)
Called to discuss with Bernard Lewis about Covid symptoms and the use of casirivimab and imdevimab, a monoclonal antibody infusion for those with mild to moderate Covid symptoms and at a high risk of hospitalization.     Pt is qualified for this infusion at the [facility] infusion center due to co-morbid conditions and/or a member of an at-risk group, however declines infusion at this time. He does not wish to go over anything and would not like to call back again. He did not wanted infusion center informations.   Patient Active Problem List   Diagnosis Date Noted  . OSA (obstructive sleep apnea) 08/07/2017  . Cholelithiasis and acute cholecystitis without obstruction 06/19/2017  . 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
